# Patient Record
Sex: Female | Born: 1990 | Race: Black or African American | Hispanic: No | Marital: Single | Smoking: Never smoker
Health system: Southern US, Community
[De-identification: ages and names within clinical notes are randomized; demographics above are authoritative.]

---
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
## 2009-09-07 ENCOUNTER — Emergency Department (HOSPITAL_COMMUNITY): Admission: EM | Admit: 2009-09-07 | Discharge: 2009-09-07 | Payer: Self-pay | Admitting: Emergency Medicine

## 2010-10-08 LAB — URINE MICROSCOPIC-ADD ON

## 2010-10-08 LAB — URINALYSIS, ROUTINE W REFLEX MICROSCOPIC
Bilirubin Urine: NEGATIVE
Hgb urine dipstick: NEGATIVE
Nitrite: POSITIVE — AB
Specific Gravity, Urine: 1.037 — ABNORMAL HIGH (ref 1.005–1.030)
Urobilinogen, UA: 1 mg/dL (ref 0.0–1.0)
pH: 6 (ref 5.0–8.0)

## 2010-10-08 LAB — HEMOCCULT GUIAC POC 1CARD (OFFICE): Fecal Occult Bld: NEGATIVE

## 2011-01-31 ENCOUNTER — Emergency Department (HOSPITAL_COMMUNITY): Payer: Self-pay

## 2011-01-31 ENCOUNTER — Emergency Department (HOSPITAL_COMMUNITY)
Admission: EM | Admit: 2011-01-31 | Discharge: 2011-01-31 | Disposition: A | Discharge status: Home / Self Care | Payer: Self-pay | Attending: Emergency Medicine | Admitting: Emergency Medicine

## 2011-01-31 DIAGNOSIS — T07XXXA Unspecified multiple injuries, initial encounter: Secondary | ICD-10-CM | POA: Insufficient documentation

## 2011-01-31 DIAGNOSIS — IMO0002 Reserved for concepts with insufficient information to code with codable children: Secondary | ICD-10-CM | POA: Insufficient documentation

## 2011-01-31 DIAGNOSIS — H5710 Ocular pain, unspecified eye: Secondary | ICD-10-CM | POA: Insufficient documentation

## 2011-01-31 DIAGNOSIS — R6884 Jaw pain: Secondary | ICD-10-CM | POA: Insufficient documentation

## 2011-06-23 ENCOUNTER — Emergency Department (HOSPITAL_COMMUNITY)
Admission: EM | Admit: 2011-06-23 | Discharge: 2011-06-23 | Disposition: A | Discharge status: Home / Self Care | Payer: Self-pay | Attending: Emergency Medicine | Admitting: Emergency Medicine

## 2011-06-23 ENCOUNTER — Encounter: Payer: Self-pay | Admitting: Emergency Medicine

## 2011-06-23 DIAGNOSIS — R070 Pain in throat: Secondary | ICD-10-CM | POA: Insufficient documentation

## 2011-06-23 DIAGNOSIS — B9789 Other viral agents as the cause of diseases classified elsewhere: Secondary | ICD-10-CM | POA: Insufficient documentation

## 2011-06-23 DIAGNOSIS — R5381 Other malaise: Secondary | ICD-10-CM | POA: Insufficient documentation

## 2011-06-23 DIAGNOSIS — R5383 Other fatigue: Secondary | ICD-10-CM | POA: Insufficient documentation

## 2011-06-23 DIAGNOSIS — R51 Headache: Secondary | ICD-10-CM | POA: Insufficient documentation

## 2011-06-23 DIAGNOSIS — B349 Viral infection, unspecified: Secondary | ICD-10-CM

## 2011-06-23 DIAGNOSIS — R110 Nausea: Secondary | ICD-10-CM | POA: Insufficient documentation

## 2011-06-23 DIAGNOSIS — J3489 Other specified disorders of nose and nasal sinuses: Secondary | ICD-10-CM | POA: Insufficient documentation

## 2011-06-23 LAB — URINALYSIS, ROUTINE W REFLEX MICROSCOPIC
Bilirubin Urine: NEGATIVE
Glucose, UA: NEGATIVE mg/dL
Hgb urine dipstick: NEGATIVE
Specific Gravity, Urine: 1.009 (ref 1.005–1.030)

## 2011-06-23 LAB — POCT PREGNANCY, URINE: Preg Test, Ur: NEGATIVE

## 2011-06-23 LAB — URINE MICROSCOPIC-ADD ON

## 2011-06-23 MED ORDER — ONDANSETRON 4 MG PO TBDP: 4.0000 mg | ORAL_TABLET | Freq: Once | ORAL | Status: DC

## 2011-06-23 MED ORDER — IBUPROFEN 200 MG PO TABS
600.0000 mg | ORAL_TABLET | Freq: Once | ORAL | Status: AC
  Administered 2011-06-23: 600 mg via ORAL
  Filled 2011-06-23: qty 3

## 2011-06-23 MED ORDER — FENTANYL CITRATE 0.05 MG/ML IJ SOLN
INTRAMUSCULAR | Status: AC
  Filled 2011-06-23: qty 2

## 2011-06-23 MED ORDER — ONDANSETRON HCL 4 MG/2ML IJ SOLN
INTRAMUSCULAR | Status: AC
  Administered 2011-06-23: 4 mg
  Filled 2011-06-23: qty 2

## 2011-06-23 MED ORDER — PROMETHAZINE HCL 25 MG PO TABS: 25.0000 mg | ORAL_TABLET | Freq: Four times a day (QID) | ORAL | Status: AC | PRN

## 2011-06-23 NOTE — ED Notes (Signed)
Pt to be discharged after treatment.  Pt still receiving IV fluids at this time. Plan of care discussed with patient.  Pt verbalized understanding.

## 2011-06-23 NOTE — ED Notes (Signed)
Pt presents from home with complaints of headache and nausea. Protocols initiated upon arrival to dept.

## 2011-06-23 NOTE — ED Notes (Signed)
Per pt report, pt awakened early yesterday morning with headache.  Per pt report, headache initially progressively worst accompanied with nausea mild dizziness.  Pt denies any vomiting.  Pt able to ambulate independently with even and steady gait.  Pt denies dizziness with ambulation.  Pt. Resting in bed with no acute distress at this time.

## 2011-06-23 NOTE — ED Notes (Signed)
Pt endorses nausea at this time.  Pt denies pain at this time.

## 2011-06-23 NOTE — ED Notes (Signed)
Pt provided with discharge instructions.  Pt provided with work excuse and prescription.  Discharge plan discussed with pt. Pt verbalized understanding.

## 2011-06-23 NOTE — ED Provider Notes (Signed)
History     CSN: [id number] Arrival date & time: 06/23/2011  8:22 AM   First MD Initiated Contact with Patient 06/23/11 0845      Chief Complaint  Patient presents with  . Headache  . Nausea    (Consider location/radiation/quality/duration/timing/severity/associated sxs/prior treatment) The history is provided by the patient.   patient is a 20 year old female with no medical problems he presents with nausea and general malaise. She states this started about one to 2 days ago. It has been constant and gradually worsening. She has mild rhinorrhea and had a mild sore throat yesterday but that has resolved. No cough. No dyspnea. No myalgias. Patient also denies fever. No dysuria, flank pain, vomiting or diarrhea. Also denies rash or recent travel. No sick contacts. Overall severity is mild. Nothing noted to make it better or worse.    History reviewed. No pertinent past medical history.  History reviewed. No pertinent past surgical history.  History reviewed. No pertinent family history.  History  Substance Use Topics  . Smoking status: Not on file  . Smokeless tobacco: Not on file  . Alcohol Use: 1.0 oz/week    2 drink(s) per week    OB History    Grav Para Term Preterm Abortions TAB SAB Ect Mult Living                  Review of Systems  Constitutional: Negative for fever and chills.  HENT: Positive for congestion and rhinorrhea. Negative for facial swelling.   Eyes: Negative for visual disturbance.  Respiratory: Negative for cough, chest tightness, shortness of breath and wheezing.   Cardiovascular: Negative for chest pain.  Gastrointestinal: Positive for nausea. Negative for vomiting, abdominal pain and diarrhea.  Genitourinary: Negative for difficulty urinating.  Skin: Negative for rash.  Neurological: Negative for weakness and numbness.  Psychiatric/Behavioral: Negative for behavioral problems and confusion.  All other systems reviewed and are  negative.    Allergies  Review of patient's allergies indicates no known allergies.  Home Medications   Current Outpatient Rx  Name Route Sig Dispense Refill  . ASPIRIN 325 MG PO TABS Oral Take 650 mg by mouth once. discomfort     . PROMETHAZINE HCL 25 MG PO TABS Oral Take 1 tablet (25 mg total) by mouth every 6 (six) hours as needed for nausea. 30 tablet 0    BP 119/71  Pulse 98  Temp(Src) 98.6 F (37 C) (Oral)  Resp 18  SpO2 100%  LMP 06/09/2011  Physical Exam  Nursing note and vitals reviewed. Constitutional: She is oriented to person, place, and time. She appears well-developed and well-nourished. No distress.  HENT:  Head: Normocephalic.  Nose: Nose normal.  Mouth/Throat: Oropharynx is clear and moist. No oropharyngeal exudate.  Eyes: EOM are normal.  Neck: Normal range of motion. Neck supple.  Cardiovascular: Normal rate, regular rhythm and intact distal pulses.   Pulmonary/Chest: Effort normal and breath sounds normal. No respiratory distress.  Abdominal: Soft. She exhibits no distension. There is no tenderness.  Musculoskeletal: Normal range of motion. She exhibits no edema and no tenderness.  Neurological: She is alert and oriented to person, place, and time.       Normal strength  Skin: Skin is warm and dry. No rash noted. She is not diaphoretic.  Psychiatric: She has a normal mood and affect. Her behavior is normal. Thought content normal.    ED Course  Procedures (including critical care time)  Labs Reviewed  URINALYSIS, ROUTINE W REFLEX  MICROSCOPIC - Abnormal; Notable for the following:    Leukocytes, UA SMALL (*)    All other components within normal limits  URINE MICROSCOPIC-ADD ON - Abnormal; Notable for the following:    Squamous Epithelial / LPF FEW (*)    All other components within normal limits  POCT PREGNANCY, URINE  POCT URINALYSIS DIPSTICK   No results found.   1. Systemic viral illness       MDM  The patient is well-hydrated  well-appearing and exam. Vital signs are normal and room air sats are 100%. He PT and UA are negative. Patient's abdomen is soft and nontender. Clinical picture is consistent with systemic viral illness. No cough or myalgias to suggest flu. No signs or symptoms of pulmonary or intra-abdominal infection. Patient given symptom control and is stable for discharge. Return precautions discussed.         Milus Glazier 06/23/11 1007

## 2011-06-24 NOTE — ED Provider Notes (Signed)
I saw and evaluated the patient, reviewed the resident's note and I agree with the findings and plan. Likely viral infection. Feels better after treatment will be discharged home  Juliet Rude. Rubin Payor, MD 06/24/11 1945

## 2011-08-16 ENCOUNTER — Emergency Department (HOSPITAL_BASED_OUTPATIENT_CLINIC_OR_DEPARTMENT_OTHER)
Admission: EM | Admit: 2011-08-16 | Discharge: 2011-08-16 | Disposition: A | Discharge status: Home / Self Care | Payer: No Typology Code available for payment source | Attending: Emergency Medicine | Admitting: Emergency Medicine

## 2011-08-16 ENCOUNTER — Encounter (HOSPITAL_BASED_OUTPATIENT_CLINIC_OR_DEPARTMENT_OTHER): Payer: Self-pay

## 2011-08-16 ENCOUNTER — Emergency Department (INDEPENDENT_AMBULATORY_CARE_PROVIDER_SITE_OTHER): Payer: No Typology Code available for payment source

## 2011-08-16 DIAGNOSIS — S139XXA Sprain of joints and ligaments of unspecified parts of neck, initial encounter: Secondary | ICD-10-CM | POA: Insufficient documentation

## 2011-08-16 DIAGNOSIS — S60222A Contusion of left hand, initial encounter: Secondary | ICD-10-CM

## 2011-08-16 DIAGNOSIS — S161XXA Strain of muscle, fascia and tendon at neck level, initial encounter: Secondary | ICD-10-CM

## 2011-08-16 DIAGNOSIS — S8000XA Contusion of unspecified knee, initial encounter: Secondary | ICD-10-CM

## 2011-08-16 DIAGNOSIS — Y92410 Unspecified street and highway as the place of occurrence of the external cause: Secondary | ICD-10-CM | POA: Insufficient documentation

## 2011-08-16 DIAGNOSIS — S60229A Contusion of unspecified hand, initial encounter: Secondary | ICD-10-CM | POA: Insufficient documentation

## 2011-08-16 DIAGNOSIS — M542 Cervicalgia: Secondary | ICD-10-CM

## 2011-08-16 MED ORDER — IBUPROFEN 800 MG PO TABS: 800.0000 mg | ORAL_TABLET | Freq: Three times a day (TID) | ORAL | Status: AC

## 2011-08-16 MED ORDER — IBUPROFEN 800 MG PO TABS
800.0000 mg | ORAL_TABLET | Freq: Once | ORAL | Status: AC
  Administered 2011-08-16: 800 mg via ORAL
  Filled 2011-08-16: qty 1
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
## 2011-08-19 ENCOUNTER — Emergency Department (HOSPITAL_BASED_OUTPATIENT_CLINIC_OR_DEPARTMENT_OTHER)
Admission: EM | Admit: 2011-08-19 | Discharge: 2011-08-19 | Disposition: A | Discharge status: Home / Self Care | Payer: No Typology Code available for payment source | Attending: Emergency Medicine | Admitting: Emergency Medicine

## 2011-08-19 ENCOUNTER — Encounter (HOSPITAL_BASED_OUTPATIENT_CLINIC_OR_DEPARTMENT_OTHER): Payer: Self-pay | Admitting: Family Medicine

## 2011-08-19 ENCOUNTER — Emergency Department (INDEPENDENT_AMBULATORY_CARE_PROVIDER_SITE_OTHER): Payer: No Typology Code available for payment source

## 2011-08-19 DIAGNOSIS — M79609 Pain in unspecified limb: Secondary | ICD-10-CM

## 2011-08-19 DIAGNOSIS — M949 Disorder of cartilage, unspecified: Secondary | ICD-10-CM

## 2011-08-19 DIAGNOSIS — S60229A Contusion of unspecified hand, initial encounter: Secondary | ICD-10-CM | POA: Insufficient documentation
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
## 2011-08-22 NOTE — ED Provider Notes (Signed)
Medical screening examination/treatment/procedure(s) were performed by non-physician practitioner and as supervising physician I was immediately available for consultation/collaboration.   Loren Racer, MD 08/22/11 1700

## 2012-06-21 ENCOUNTER — Emergency Department (HOSPITAL_COMMUNITY)
Admission: EM | Admit: 2012-06-21 | Discharge: 2012-06-21 | Discharge status: Against Medical Advice | Payer: Self-pay | Attending: Emergency Medicine | Admitting: Emergency Medicine

## 2012-06-21 ENCOUNTER — Encounter (HOSPITAL_COMMUNITY): Payer: Self-pay | Admitting: *Deleted

## 2012-06-21 DIAGNOSIS — R5381 Other malaise: Secondary | ICD-10-CM | POA: Insufficient documentation

## 2012-06-21 DIAGNOSIS — F17200 Nicotine dependence, unspecified, uncomplicated: Secondary | ICD-10-CM | POA: Insufficient documentation

## 2012-06-21 DIAGNOSIS — R112 Nausea with vomiting, unspecified: Secondary | ICD-10-CM | POA: Insufficient documentation

## 2012-06-21 NOTE — ED Notes (Signed)
Pt states she doesn't want to be here; states she didn't call EMS that her coworkers did; states she has already called someone to come get her; states is nauseated but doesn't have any money to stay and get treatment

## 2012-06-21 NOTE — ED Notes (Signed)
Pt refused labs.  

## 2012-06-21 NOTE — ED Notes (Signed)
Per EMS pt c/o nausea/vomiting; generalized weakness;

## 2012-06-21 NOTE — ED Notes (Signed)
Pt called for family to pick her up; on arrival pt refusing to stay; signed elopement form; ambulated to car; instructed to return at any time

## 2014-07-13 ENCOUNTER — Encounter (HOSPITAL_BASED_OUTPATIENT_CLINIC_OR_DEPARTMENT_OTHER): Payer: Self-pay

## 2014-07-13 ENCOUNTER — Emergency Department (HOSPITAL_BASED_OUTPATIENT_CLINIC_OR_DEPARTMENT_OTHER)
Admission: EM | Admit: 2014-07-13 | Discharge: 2014-07-13 | Disposition: A | Discharge status: Home / Self Care | Payer: No Typology Code available for payment source | Attending: Emergency Medicine | Admitting: Emergency Medicine

## 2014-07-13 DIAGNOSIS — Z23 Encounter for immunization: Secondary | ICD-10-CM | POA: Insufficient documentation

## 2014-07-13 DIAGNOSIS — Y9289 Other specified places as the place of occurrence of the external cause: Secondary | ICD-10-CM | POA: Insufficient documentation

## 2014-07-13 DIAGNOSIS — Y998 Other external cause status: Secondary | ICD-10-CM | POA: Insufficient documentation

## 2014-07-13 DIAGNOSIS — S56909A Unspecified injury of unspecified muscles, fascia and tendons at forearm level, unspecified arm, initial encounter: Secondary | ICD-10-CM | POA: Insufficient documentation

## 2014-07-13 DIAGNOSIS — Z720 Tobacco use: Secondary | ICD-10-CM | POA: Insufficient documentation

## 2014-07-13 DIAGNOSIS — S0083XA Contusion of other part of head, initial encounter: Secondary | ICD-10-CM | POA: Insufficient documentation

## 2014-07-13 DIAGNOSIS — Y9389 Activity, other specified: Secondary | ICD-10-CM | POA: Insufficient documentation

## 2014-07-13 MED ORDER — BACITRACIN-NEOMYCIN-POLYMYXIN OINTMENT TUBE
TOPICAL_OINTMENT | Freq: Three times a day (TID) | CUTANEOUS | Status: DC
  Administered 2014-07-13: 1 via TOPICAL
  Filled 2014-07-13: qty 15

## 2014-07-13 MED ORDER — TETANUS-DIPHTH-ACELL PERTUSSIS 5-2.5-18.5 LF-MCG/0.5 IM SUSP
0.5000 mL | Freq: Once | INTRAMUSCULAR | Status: AC
  Administered 2014-07-13: 0.5 mL via INTRAMUSCULAR
  Filled 2014-07-13: qty 0.5

## 2014-07-13 NOTE — ED Notes (Signed)
I completed wound care per Dr. Read DriversMolpus. I gently used a Chlora-swab on wound (left lower cheek). I then applied bacitracin, and covered with 2x2's and secured with paper tape. I gave patient additional supplies for home care.
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
## 2014-07-13 NOTE — ED Notes (Signed)
Pt states that she has a safe place to go at d/c. Friend at bedside.
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
## 2015-08-09 ENCOUNTER — Encounter (HOSPITAL_BASED_OUTPATIENT_CLINIC_OR_DEPARTMENT_OTHER): Payer: Self-pay | Admitting: Emergency Medicine

## 2015-08-09 ENCOUNTER — Emergency Department (HOSPITAL_BASED_OUTPATIENT_CLINIC_OR_DEPARTMENT_OTHER)
Admission: EM | Admit: 2015-08-09 | Discharge: 2015-08-10 | Disposition: A | Discharge status: Home / Self Care | Payer: Self-pay | Attending: Emergency Medicine | Admitting: Emergency Medicine

## 2015-08-09 DIAGNOSIS — F17200 Nicotine dependence, unspecified, uncomplicated: Secondary | ICD-10-CM | POA: Insufficient documentation

## 2015-08-09 DIAGNOSIS — A5901 Trichomonal vulvovaginitis: Secondary | ICD-10-CM | POA: Insufficient documentation

## 2015-08-09 DIAGNOSIS — Z3202 Encounter for pregnancy test, result negative: Secondary | ICD-10-CM | POA: Insufficient documentation

## 2015-08-09 NOTE — ED Notes (Signed)
Patient states that she is having lower pelvic pain x 1 week. She feels like she may have a tampon lost in her vaginal area period ended last week. Reports that she has a discharge at times

## 2015-08-10 LAB — URINALYSIS, ROUTINE W REFLEX MICROSCOPIC
Bilirubin Urine: NEGATIVE
GLUCOSE, UA: NEGATIVE mg/dL
HGB URINE DIPSTICK: NEGATIVE
Ketones, ur: NEGATIVE mg/dL
Nitrite: POSITIVE — AB
Protein, ur: NEGATIVE mg/dL
SPECIFIC GRAVITY, URINE: 1.019 (ref 1.005–1.030)
pH: 6 (ref 5.0–8.0)

## 2015-08-10 LAB — URINE MICROSCOPIC-ADD ON

## 2015-08-10 LAB — WET PREP, GENITAL
Clue Cells Wet Prep HPF POC: NONE SEEN
SPERM: NONE SEEN
Yeast Wet Prep HPF POC: NONE SEEN

## 2015-08-10 LAB — PREGNANCY, URINE: Preg Test, Ur: NEGATIVE

## 2015-08-10 MED ORDER — METRONIDAZOLE 500 MG PO TABS
2000.0000 mg | ORAL_TABLET | Freq: Once | ORAL | Status: AC
  Administered 2015-08-10: 2000 mg via ORAL
  Filled 2015-08-10: qty 4

## 2015-08-10 NOTE — Discharge Instructions (Signed)
Trichomoniasis Trichomoniasis is an infection caused by an organism called Trichomonas. The infection can affect both women and men. In women, the outer female genitalia and the vagina are affected. In men, the penis is mainly affected, but the prostate and other reproductive organs can also be involved. Trichomoniasis is a sexually transmitted infection (STI) and is most often passed to another person through sexual contact.  RISK FACTORS  Having unprotected sexual intercourse.  Having sexual intercourse with an infected partner. SIGNS AND SYMPTOMS  Symptoms of trichomoniasis in women include:  Abnormal gray-green frothy vaginal discharge.  Itching and irritation of the vagina.  Itching and irritation of the area outside the vagina. Symptoms of trichomoniasis in men include:   Penile discharge with or without pain.  Pain during urination. This results from inflammation of the urethra. DIAGNOSIS  Trichomoniasis may be found during a Pap test or physical exam. Your health care provider may use one of the following methods to help diagnose this infection:  Testing the pH of the vagina with a test tape.  Using a vaginal swab test that checks for the Trichomonas organism. A test is available that provides results within a few minutes.  Examining a urine sample.  Testing vaginal secretions. Your health care provider may test you for other STIs, including HIV. TREATMENT   You may be given medicine to fight the infection. Women should inform their health care provider if they could be or are pregnant. Some medicines used to treat the infection should not be taken during pregnancy.  Your health care provider may recommend over-the-counter medicines or creams to decrease itching or irritation.  Your sexual partner will need to be treated if infected.  Your health care provider may test you for infection again 3 months after treatment. HOME CARE INSTRUCTIONS   Take medicines only as  directed by your health care provider.  Take over-the-counter medicine for itching or irritation as directed by your health care provider.  Do not have sexual intercourse while you have the infection.  Women should not douche or wear tampons while they have the infection.  Discuss your infection with your partner. Your partner may have gotten the infection from you, or you may have gotten it from your partner.  Have your sex partner get examined and treated if necessary.  Practice safe, informed, and protected sex.  See your health care provider for other STI testing. SEEK MEDICAL CARE IF:   You still have symptoms after you finish your medicine.  You develop abdominal pain.  You have pain when you urinate.  You have bleeding after sexual intercourse.  You develop a rash.  Your medicine makes you sick or makes you throw up (vomit). MAKE SURE YOU:  Understand these instructions.  Will watch your condition.  Will get help right away if you are not doing well or get worse.   This information is not intended to replace advice given to you by your health care provider. Make sure you discuss any questions you have with your health care provider.   Document Released: 12/28/2000 Document Revised: 07/25/2014 Document Reviewed: 04/15/2013 Elsevier Interactive Patient Education 2016 [employer]  

## 2015-08-10 NOTE — ED Provider Notes (Signed)
CSN: [id number]     Arrival date & time 08/09/15  2305 History   First MD Initiated Contact with Patient 08/10/15 0104     Chief Complaint  Patient presents with  . Retained Tampon      (Consider location/radiation/quality/duration/timing/severity/associated sxs/prior Treatment) HPI  This is a 25 year old female who believes she left a tampon in her vagina 2 weeks ago. She does not recall removing it after her period ended. She is now having an abnormal vaginal odor with a white discharge. She is also having some mild pelvic pain. She denies dysuria, fever, chills, nausea, vomiting, diarrhea or vaginal bleeding.  History reviewed. No pertinent past medical history. History reviewed. No pertinent past surgical history. History reviewed. No pertinent family history. Social History  Substance Use Topics  . Smoking status: Current Every Day Smoker -- 0.50 packs/day  . Smokeless tobacco: None  . Alcohol Use: 1.0 oz/week    2 drink(s) per week   OB History    No data available     Review of Systems  All other systems reviewed and are negative.   Allergies  Review of patient's allergies indicates no known allergies.  Home Medications   Prior to Admission medications   Not on File   BP 123/82 mmHg  Pulse 81  Temp(Src) 98 F (36.7 C) (Oral)  Resp 18  Wt 140 lb (63.504 kg)  SpO2 100%  LMP 08/02/2015   Physical Exam  General: Well-developed, well-nourished female in no acute distress; appearance consistent with age of record HENT: normocephalic; atraumatic Eyes: pupils equal, round and reactive to light; extraocular muscles intact Neck: supple Heart: regular rate and rhythm Lungs: clear to auscultation bilaterally Abdomen: soft; nondistended; nontender; no masses or hepatosplenomegaly; bowel sounds present GU: Normal external genitalia; white vaginal discharge; no vaginal bleeding; no cervical motion tenderness; no retained tampon Extremities: No deformity; full range of  motion; pulses normal Neurologic: Awake, alert and oriented; motor function intact in all extremities and symmetric; no facial droop Skin: Warm and dry Psychiatric: Normal mood and affect    ED Course  Procedures (including critical care time)   MDM   Nursing notes and vitals signs, including pulse oximetry, reviewed.  Summary of this visit's results, reviewed by myself:  Labs:  Results for orders placed or performed during the hospital encounter of 08/09/15 (from the past 24 hour(s))  Urinalysis, Routine w reflex microscopic (not at [facility])     Status: Abnormal   Collection Time: 08/09/15 11:45 PM  Result Value Ref Range   Color, Urine YELLOW YELLOW   APPearance CLOUDY (A) CLEAR   Specific Gravity, Urine 1.019 1.005 - 1.030   pH 6.0 5.0 - 8.0   Glucose, UA NEGATIVE NEGATIVE mg/dL   Hgb urine dipstick NEGATIVE NEGATIVE   Bilirubin Urine NEGATIVE NEGATIVE   Ketones, ur NEGATIVE NEGATIVE mg/dL   Protein, ur NEGATIVE NEGATIVE mg/dL   Nitrite POSITIVE (A) NEGATIVE   Leukocytes, UA MODERATE (A) NEGATIVE  Urine microscopic-add on     Status: Abnormal   Collection Time: 08/09/15 11:45 PM  Result Value Ref Range   Squamous Epithelial / LPF 0-5 (A) NONE SEEN   WBC, UA 6-30 0 - 5 WBC/hpf   RBC / HPF 0-5 0 - 5 RBC/hpf   Bacteria, UA MANY (A) NONE SEEN  Pregnancy, urine     Status: None   Collection Time: 08/09/15 11:55 PM  Result Value Ref Range   Preg Test, Ur NEGATIVE NEGATIVE  Wet prep, genital  Status: Abnormal   Collection Time: 08/10/15  1:30 AM  Result Value Ref Range   Yeast Wet Prep HPF POC NONE SEEN NONE SEEN   Trich, Wet Prep PRESENT (A) NONE SEEN   Clue Cells Wet Prep HPF POC NONE SEEN NONE SEEN   WBC, Wet Prep HPF POC MODERATE (A) NONE SEEN   Sperm NONE SEEN    Urine sent for culture. Since patient is asymptomatic we will not treat for urinary tract infection at this time. The pyuria may be related to her trichomoniasis.      Paula Libra, MD 08/10/15  [phone]

## 2015-08-11 LAB — GC/CHLAMYDIA PROBE AMP (~~LOC~~) NOT AT ARMC
CHLAMYDIA, DNA PROBE: NEGATIVE
NEISSERIA GONORRHEA: NEGATIVE

## 2015-08-12 LAB — URINE CULTURE: Culture: >=100000

## 2015-08-13 ENCOUNTER — Telehealth (HOSPITAL_BASED_OUTPATIENT_CLINIC_OR_DEPARTMENT_OTHER): Payer: Self-pay | Admitting: Emergency Medicine

## 2015-08-13 NOTE — Telephone Encounter (Signed)
Post ED Visit - Positive Culture Follow-up: Successful Patient Follow-Up  Culture assessed and recommendations reviewed by: []  Enzo Bi, Pharm.D.  Celedonio Miyamoto, Pharm.D. AG>  [place], [address].D., BCPS, AAHIVP  Estella Husk, Pharm.D., BCPS, AAHIVP  Tennis Must, Pharm.D.  Sherle Poe, [address].D.  Positive urine culture E. coli   Patient discharged without antimicrobial prescription and treatment is now indicated  Organism is resistant to prescribed ED discharge antimicrobial  Patient with positive blood cultures  Changes discussed with ED provider: Gaylyn Rong PA New antibiotic prescription Keflex 500 mg po bid x 7 days Called to [employer] [address]  Contacted patient, 08/13/15 1628   Berle Mull 08/13/2015, 4:27 PM

## 2015-08-13 NOTE — Progress Notes (Signed)
ED Antimicrobial Stewardship Positive Culture Follow Up   Mary Knight is an 25 y.o. female who presented to [facility] on 08/09/2015 with a chief complaint of  Chief Complaint  Patient presents with  . Retained Tampon     Recent Results (from the past 720 hour(s))  Urine culture     Status: None   Collection Time: 08/09/15 11:55 PM  Result Value Ref Range Status   Specimen Description URINE, CLEAN CATCH  Final   Special Requests NONE  Final   Culture   Final    >=100,000 COLONIES/mL ESCHERICHIA COLI Performed at [facility]    Report Status 08/12/2015 FINAL  Final   Organism ID, Bacteria ESCHERICHIA COLI  Final      Susceptibility   Escherichia coli - MIC*    AMPICILLIN <=2 SENSITIVE Sensitive     CEFAZOLIN <=4 SENSITIVE Sensitive     CEFTRIAXONE <=1 SENSITIVE Sensitive     CIPROFLOXACIN <=0.25 SENSITIVE Sensitive     GENTAMICIN <=1 SENSITIVE Sensitive     IMIPENEM <=0.25 SENSITIVE Sensitive     NITROFURANTOIN <=16 SENSITIVE Sensitive     TRIMETH/SULFA <=20 SENSITIVE Sensitive     AMPICILLIN/SULBACTAM <=2 SENSITIVE Sensitive     PIP/TAZO <=4 SENSITIVE Sensitive     * >=100,000 COLONIES/mL ESCHERICHIA COLI  Wet prep, genital     Status: Abnormal   Collection Time: 08/10/15  1:30 AM  Result Value Ref Range Status   Yeast Wet Prep HPF POC NONE SEEN NONE SEEN Final   Trich, Wet Prep PRESENT (A) NONE SEEN Final   Clue Cells Wet Prep HPF POC NONE SEEN NONE SEEN Final   WBC, Wet Prep HPF POC MODERATE (A) NONE SEEN Final   Sperm NONE SEEN  Final     Patient discharged originally without antimicrobial agent and treatment is now indicated  New antibiotic prescription: Keflex 500 mg twice daily for 7 days  ED Provider: Gaylyn Rong, PA-C  Sherron Monday, PharmD Clinical Pharmacy Resident Pager: [phone] 08/13/2015 9:29 AM

## 2016-02-06 ENCOUNTER — Emergency Department (HOSPITAL_BASED_OUTPATIENT_CLINIC_OR_DEPARTMENT_OTHER)
Admission: EM | Admit: 2016-02-06 | Discharge: 2016-02-07 | Disposition: A | Discharge status: Home / Self Care | Payer: Self-pay | Attending: Emergency Medicine | Admitting: Emergency Medicine

## 2016-02-06 ENCOUNTER — Encounter (HOSPITAL_BASED_OUTPATIENT_CLINIC_OR_DEPARTMENT_OTHER): Payer: Self-pay | Admitting: Emergency Medicine

## 2016-02-06 DIAGNOSIS — Y939 Activity, unspecified: Secondary | ICD-10-CM | POA: Insufficient documentation

## 2016-02-06 DIAGNOSIS — S0081XA Abrasion of other part of head, initial encounter: Secondary | ICD-10-CM | POA: Insufficient documentation

## 2016-02-06 DIAGNOSIS — Y999 Unspecified external cause status: Secondary | ICD-10-CM | POA: Insufficient documentation

## 2016-02-06 DIAGNOSIS — Y929 Unspecified place or not applicable: Secondary | ICD-10-CM | POA: Insufficient documentation

## 2016-02-06 DIAGNOSIS — S300XXA Contusion of lower back and pelvis, initial encounter: Secondary | ICD-10-CM | POA: Insufficient documentation

## 2016-02-06 DIAGNOSIS — F17200 Nicotine dependence, unspecified, uncomplicated: Secondary | ICD-10-CM | POA: Insufficient documentation
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
## 2016-02-07 ENCOUNTER — Other Ambulatory Visit (HOSPITAL_BASED_OUTPATIENT_CLINIC_OR_DEPARTMENT_OTHER): Payer: Self-pay | Admitting: Emergency Medicine

## 2016-02-07 ENCOUNTER — Ambulatory Visit (HOSPITAL_BASED_OUTPATIENT_CLINIC_OR_DEPARTMENT_OTHER)
Admission: RE | Admit: 2016-02-07 | Discharge: 2016-02-07 | Disposition: A | Discharge status: Home / Self Care | Payer: Self-pay | Source: Ambulatory Visit | Attending: Emergency Medicine | Admitting: Emergency Medicine

## 2016-02-07 ENCOUNTER — Emergency Department (HOSPITAL_BASED_OUTPATIENT_CLINIC_OR_DEPARTMENT_OTHER): Payer: Self-pay

## 2016-02-07 DIAGNOSIS — R52 Pain, unspecified: Secondary | ICD-10-CM

## 2016-02-07 NOTE — ED Notes (Signed)
Pt d/c'd at 0325. See paper chart documentation during downtime.  
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
## 2016-03-24 ENCOUNTER — Emergency Department (HOSPITAL_BASED_OUTPATIENT_CLINIC_OR_DEPARTMENT_OTHER): Payer: No Typology Code available for payment source

## 2016-03-24 ENCOUNTER — Encounter (HOSPITAL_BASED_OUTPATIENT_CLINIC_OR_DEPARTMENT_OTHER): Payer: Self-pay | Admitting: *Deleted

## 2016-03-24 ENCOUNTER — Emergency Department (HOSPITAL_BASED_OUTPATIENT_CLINIC_OR_DEPARTMENT_OTHER)
Admission: EM | Admit: 2016-03-24 | Discharge: 2016-03-24 | Disposition: A | Discharge status: Home / Self Care | Payer: No Typology Code available for payment source | Attending: Emergency Medicine | Admitting: Emergency Medicine

## 2016-03-24 DIAGNOSIS — Y9389 Activity, other specified: Secondary | ICD-10-CM | POA: Diagnosis not present

## 2016-03-24 DIAGNOSIS — S39012A Strain of muscle, fascia and tendon of lower back, initial encounter: Secondary | ICD-10-CM

## 2016-03-24 DIAGNOSIS — S161XXA Strain of muscle, fascia and tendon at neck level, initial encounter: Secondary | ICD-10-CM

## 2016-03-24 DIAGNOSIS — S199XXA Unspecified injury of neck, initial encounter: Secondary | ICD-10-CM | POA: Diagnosis present

## 2016-03-24 DIAGNOSIS — Y92410 Unspecified street and highway as the place of occurrence of the external cause: Secondary | ICD-10-CM | POA: Insufficient documentation

## 2016-03-24 DIAGNOSIS — F17200 Nicotine dependence, unspecified, uncomplicated: Secondary | ICD-10-CM | POA: Diagnosis not present

## 2016-03-24 DIAGNOSIS — Y999 Unspecified external cause status: Secondary | ICD-10-CM | POA: Insufficient documentation

## 2016-03-24 LAB — PREGNANCY, URINE: PREG TEST UR: NEGATIVE

## 2016-03-24 MED ORDER — NAPROXEN 375 MG PO TABS: 375.0000 mg | ORAL_TABLET | Freq: Two times a day (BID) | ORAL | 0 refills | Status: DC
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
## 2016-03-24 NOTE — ED Notes (Signed)
MD at bedside. 
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
## 2019-12-27 DIAGNOSIS — F408 Other phobic anxiety disorders: Secondary | ICD-10-CM | POA: Diagnosis not present

## 2019-12-27 DIAGNOSIS — K6289 Other specified diseases of anus and rectum: Secondary | ICD-10-CM | POA: Diagnosis not present

## 2019-12-27 DIAGNOSIS — L249 Irritant contact dermatitis, unspecified cause: Secondary | ICD-10-CM | POA: Diagnosis not present

## 2020-07-30 ENCOUNTER — Emergency Department (HOSPITAL_BASED_OUTPATIENT_CLINIC_OR_DEPARTMENT_OTHER)
Admission: EM | Admit: 2020-07-30 | Discharge: 2020-07-31 | Disposition: A | Discharge status: Home / Self Care | Payer: Managed Care, Other (non HMO) | Attending: Emergency Medicine | Admitting: Emergency Medicine

## 2020-07-30 ENCOUNTER — Emergency Department (HOSPITAL_BASED_OUTPATIENT_CLINIC_OR_DEPARTMENT_OTHER): Payer: Managed Care, Other (non HMO)

## 2020-07-30 ENCOUNTER — Encounter (HOSPITAL_BASED_OUTPATIENT_CLINIC_OR_DEPARTMENT_OTHER): Payer: Self-pay | Admitting: *Deleted

## 2020-07-30 ENCOUNTER — Other Ambulatory Visit: Payer: Self-pay

## 2020-07-30 DIAGNOSIS — R109 Unspecified abdominal pain: Secondary | ICD-10-CM | POA: Diagnosis present

## 2020-07-30 DIAGNOSIS — K5901 Slow transit constipation: Secondary | ICD-10-CM | POA: Diagnosis not present

## 2020-07-30 LAB — CBC WITH DIFFERENTIAL/PLATELET
Abs Immature Granulocytes: 0.02 10*3/uL (ref 0.00–0.07)
Basophils Absolute: 0 10*3/uL (ref 0.0–0.1)
Basophils Relative: 1 %
Eosinophils Absolute: 0.1 10*3/uL (ref 0.0–0.5)
Eosinophils Relative: 1 %
HCT: 34.5 % — ABNORMAL LOW (ref 36.0–46.0)
Hemoglobin: 11.6 g/dL — ABNORMAL LOW (ref 12.0–15.0)
Immature Granulocytes: 0 %
Lymphocytes Relative: 42 %
Lymphs Abs: 3.4 10*3/uL (ref 0.7–4.0)
MCH: 29.3 pg (ref 26.0–34.0)
MCHC: 33.6 g/dL (ref 30.0–36.0)
MCV: 87.1 fL (ref 80.0–100.0)
Monocytes Absolute: 0.7 10*3/uL (ref 0.1–1.0)
Monocytes Relative: 9 %
Neutro Abs: 3.8 10*3/uL (ref 1.7–7.7)
Neutrophils Relative %: 47 %
Platelets: 400 10*3/uL (ref 150–400)
RBC: 3.96 MIL/uL (ref 3.87–5.11)
RDW: 14.6 % (ref 11.5–15.5)
WBC: 8.1 10*3/uL (ref 4.0–10.5)
nRBC: 0 % (ref 0.0–0.2)

## 2020-07-30 LAB — COMPREHENSIVE METABOLIC PANEL
ALT: 10 U/L (ref 0–44)
AST: 14 U/L — ABNORMAL LOW (ref 15–41)
Albumin: 3.3 g/dL — ABNORMAL LOW (ref 3.5–5.0)
Alkaline Phosphatase: 47 U/L (ref 38–126)
Anion gap: 9 (ref 5–15)
BUN: 9 mg/dL (ref 6–20)
CO2: 22 mmol/L (ref 22–32)
Calcium: 8.6 mg/dL — ABNORMAL LOW (ref 8.9–10.3)
Chloride: 107 mmol/L (ref 98–111)
Creatinine, Ser: 0.62 mg/dL (ref 0.44–1.00)
GFR, Estimated: >60 mL/min (ref >60)
Glucose, Bld: 84 mg/dL (ref 70–99)
Potassium: 3.6 mmol/L (ref 3.5–5.1)
Sodium: 138 mmol/L (ref 135–145)
Total Bilirubin: 0.3 mg/dL (ref 0.3–1.2)
Total Protein: 7.1 g/dL (ref 6.5–8.1)

## 2020-07-30 LAB — PREGNANCY, URINE: Preg Test, Ur: NEGATIVE

## 2020-07-30 NOTE — ED Triage Notes (Signed)
Abdominal pain d/t constipation.  Patient stated that she has not had a good bowel movement for years.  Small bowel movement every time she defecates.  Has tried several laxatives but it is ineffective. No PCP.

## 2020-07-30 NOTE — ED Provider Notes (Signed)
[facility] EMERGENCY DEPARTMENT Provider Note   CSN: [id number] Arrival date & time: 07/30/20  1801     History Chief Complaint  Patient presents with  . Abdominal Pain    Mary Knight is a 30 y.o. female who presents to ED with a chief complaint of abdominal pain.  States that over the past several years she has had 1-2 bowel movements a week and feels like she is not emptying her colon completely.  States that she has gone to several specialist as well as taking several laxatives without much improvement.  She states that now she is having some abdominal pain and decreased flatulence.  No vomiting or decreased p.o. intake.  Reports some bloody stools in the past but none recently.  States that "no one has ever done anything for me so I just want to know what is going on."  Has not taken any laxatives and is not on a bowel regimen currently.  No prior abdominal surgeries.  No urinary symptoms, pelvic complaints or possibility of pregnancy.  Her last bowel movement was today but states that "it was just a really small one."  HPI     History reviewed. No pertinent past medical history.  There are no problems to display for this patient.   History reviewed. No pertinent surgical history.   OB History   No obstetric history on file.     No family history on file.  Social History   Tobacco Use  . Smoking status: Never Smoker  . Smokeless tobacco: Never Used  Substance Use Topics  . Alcohol use: Yes    Alcohol/week: 2.0 standard drinks    Types: 2 Standard drinks or equivalent per week    Comment: occasionally  . Drug use: Yes    Types: Marijuana    Comment: quits 2 weeks ago    Home Medications Prior to Admission medications   Medication Sig Start Date End Date Taking? Authorizing Provider  polyethylene glycol (GOLYTELY) 236 g solution Take 4,000 mLs by mouth once for 1 dose. 07/31/20 07/31/20 Yes Tyaire Odem, PA-C  naproxen (NAPROSYN) 375 MG tablet Take 1  tablet (375 mg total) by mouth 2 (two) times daily. 03/24/16   Rolan Bucco, MD    Allergies    Amoxicillin  Review of Systems   Review of Systems  Constitutional: Negative for appetite change, chills and fever.  HENT: Negative for ear pain, rhinorrhea, sneezing and sore throat.   Eyes: Negative for photophobia and visual disturbance.  Respiratory: Negative for cough, chest tightness, shortness of breath and wheezing.   Cardiovascular: Negative for chest pain and palpitations.  Gastrointestinal: Positive for abdominal pain and constipation. Negative for blood in stool, diarrhea, nausea and vomiting.  Genitourinary: Negative for dysuria, hematuria and urgency.  Musculoskeletal: Negative for myalgias.  Skin: Negative for rash.  Neurological: Negative for dizziness, weakness and light-headedness.    Physical Exam Updated Vital Signs BP 127/90 (BP Location: Right Arm)   Pulse 68   Temp 98.4 F (36.9 C) (Oral)   Resp 18   Ht 5\' 3"  (1.6 m)   Wt 70.3 kg   LMP 07/28/2020   SpO2 100%   BMI 27.46 kg/m   Physical Exam Vitals and nursing note reviewed.  Constitutional:      General: She is not in acute distress.    Appearance: She is well-developed and well-nourished.  HENT:     Head: Normocephalic and atraumatic.     Nose: Nose normal.  Eyes:  General: No scleral icterus.       Left eye: No discharge.     Extraocular Movements: EOM normal.     Conjunctiva/sclera: Conjunctivae normal.  Cardiovascular:     Rate and Rhythm: Normal rate and regular rhythm.     Pulses: Intact distal pulses.     Heart sounds: Normal heart sounds. No murmur heard. No friction rub. No gallop.   Pulmonary:     Effort: Pulmonary effort is normal. No respiratory distress.     Breath sounds: Normal breath sounds.  Abdominal:     General: Bowel sounds are normal. There is no distension.     Palpations: Abdomen is soft.     Tenderness: There is no abdominal tenderness. There is no guarding.   Musculoskeletal:        General: No edema. Normal range of motion.     Cervical back: Normal range of motion and neck supple.  Skin:    General: Skin is warm and dry.     Findings: No rash.  Neurological:     Mental Status: She is alert.     Motor: No abnormal muscle tone.     Coordination: Coordination normal.  Psychiatric:        Mood and Affect: Mood and affect normal.     ED Results / Procedures / Treatments   Labs (all labs ordered are listed, but only abnormal results are displayed) Labs Reviewed  COMPREHENSIVE METABOLIC PANEL - Abnormal; Notable for the following components:      Result Value   Calcium 8.6 (*)    Albumin 3.3 (*)    AST 14 (*)    All other components within normal limits  CBC WITH DIFFERENTIAL/PLATELET - Abnormal; Notable for the following components:   Hemoglobin 11.6 (*)    HCT 34.5 (*)    All other components within normal limits  PREGNANCY, URINE    EKG None  Radiology DG Abdomen 1 View  Result Date: 07/30/2020 CLINICAL DATA:  Constipation EXAM: ABDOMEN - 1 VIEW COMPARISON:  None. FINDINGS: Moderate stool burden in the right colon. Nonobstructive bowel gas pattern. No organomegaly or free air. No suspicious calcification or acute bony abnormality. IMPRESSION: Moderate stool burden in the right colon.  No acute findings. Electronically Signed   By: Charlett Nose M.D.   On: 07/30/2020 23:47    Procedures Procedures (including critical care time)  Medications Ordered in ED Medications - No data to display  ED Course  I have reviewed the triage vital signs and the nursing notes.  Pertinent labs & imaging results that were available during my care of the patient were reviewed by me and considered in my medical decision making (see chart for details).    MDM Rules/Calculators/A&P                          30 year old female presenting to the ED with a chief complaint of abdominal pain.  Reports issues with constipation of the past several  years but feels like she is not emptying her colon completely.  She has gone to several specialist in the past and has been taking laxatives without much improvement.  States that now she is having abdominal pain and decreased flatulence.  Denies any vomiting or changes to p.o. intake.  No bloody stools.  Her last bowel movement was today but states that "it was just a small 1."  No prior abdominal surgeries, no pelvic complaints.  On exam  his abdomen is soft without any focal tenderness.  She is tolerating CBC, CMP unremarkable.  Pregnancy test is negative.  Abdominal x-ray shows moderate stool burden without evidence of obstruction.  I suspect that this is slow transit constipation.  Advised her to incorporate high-fiber foods into her diet but in the meantime we will give GoLytely as she is wanting something to "clear everything out."  I doubt she has an obstruction as she is tolerating p.o. intake and has no significant pain.  Patient somewhat distraught that there is no obstruction because she feels like there is a blockage in her abdomen.  Informed her the signs and symptoms that would concern [place] for an obstruction that she will need to return for.  Return precautions given.  All imaging, if done today, including plain films, CT scans, and ultrasounds, independently reviewed by me, and interpretations confirmed via formal radiology reads.  Patient is hemodynamically stable, in NAD, and able to ambulate in the ED. Evaluation does not show pathology that would require ongoing emergent intervention or inpatient treatment. I explained the diagnosis to the patient. Pain has been managed and has no complaints prior to discharge. Patient is comfortable with above plan and is stable for discharge at this time. All questions were answered prior to disposition. Strict return precautions for returning to the ED were discussed. Encouraged follow up with PCP.   An After Visit Summary was printed and given to the  patient.   Portions of this note were generated with Scientist, clinical (histocompatibility and immunogenetics). Dictation errors may occur despite best attempts at proofreading.  Final Clinical Impression(s) / ED Diagnoses Final diagnoses:  Slow transit constipation    Rx / DC Orders ED Discharge Orders         Ordered    polyethylene glycol (GOLYTELY) 236 g solution   Once        07/31/20 0000           Dietrich Pates, PA-C 07/31/20 0004    Sabino [facility], MD 08/03/20 [phone]

## 2020-07-31 MED ORDER — GOLYTELY 236 G PO SOLR: 4000.0000 mL | Freq: Once | ORAL | 0 refills | Status: AC

## 2020-07-31 NOTE — Discharge Instructions (Signed)
Your x-ray shows that you have stool in your abdomen.  Take the medication as prescribed. There is no evidence that you are having a bowel obstruction. Is important for you to follow-up with the GI doctor listed below. Make sure you are drinking plenty of fluids. Return to the ER if you start to experience worsening abdominal pain, vomiting, persistent bloody stools.

## 2020-07-31 NOTE — ED Notes (Signed)
Patient verbalizes understanding of discharge instructions. Opportunity for questioning and answers were provided. Armband removed by staff, pt discharged from ED ambulatory to home.  

## 2021-03-23 DIAGNOSIS — Z20822 Contact with and (suspected) exposure to covid-19: Secondary | ICD-10-CM | POA: Diagnosis not present

## 2021-03-23 DIAGNOSIS — U071 COVID-19: Secondary | ICD-10-CM | POA: Diagnosis not present

## 2021-05-25 DIAGNOSIS — M9902 Segmental and somatic dysfunction of thoracic region: Secondary | ICD-10-CM | POA: Diagnosis not present

## 2021-05-25 DIAGNOSIS — M9901 Segmental and somatic dysfunction of cervical region: Secondary | ICD-10-CM | POA: Diagnosis not present

## 2021-05-25 DIAGNOSIS — M9903 Segmental and somatic dysfunction of lumbar region: Secondary | ICD-10-CM | POA: Diagnosis not present

## 2021-06-02 DIAGNOSIS — Z6829 Body mass index (BMI) 29.0-29.9, adult: Secondary | ICD-10-CM | POA: Diagnosis not present

## 2021-06-02 DIAGNOSIS — Z01411 Encounter for gynecological examination (general) (routine) with abnormal findings: Secondary | ICD-10-CM | POA: Diagnosis not present

## 2021-06-02 DIAGNOSIS — Z01419 Encounter for gynecological examination (general) (routine) without abnormal findings: Secondary | ICD-10-CM | POA: Diagnosis not present

## 2021-06-02 DIAGNOSIS — N946 Dysmenorrhea, unspecified: Secondary | ICD-10-CM | POA: Diagnosis not present

## 2021-06-02 DIAGNOSIS — Z118 Encounter for screening for other infectious and parasitic diseases: Secondary | ICD-10-CM | POA: Diagnosis not present

## 2021-06-02 DIAGNOSIS — Z124 Encounter for screening for malignant neoplasm of cervix: Secondary | ICD-10-CM | POA: Diagnosis not present

## 2021-06-21 DIAGNOSIS — Z0000 Encounter for general adult medical examination without abnormal findings: Secondary | ICD-10-CM | POA: Diagnosis not present

## 2021-06-21 DIAGNOSIS — Z113 Encounter for screening for infections with a predominantly sexual mode of transmission: Secondary | ICD-10-CM | POA: Diagnosis not present

## 2021-06-21 DIAGNOSIS — Z1159 Encounter for screening for other viral diseases: Secondary | ICD-10-CM | POA: Diagnosis not present

## 2021-06-21 DIAGNOSIS — Z131 Encounter for screening for diabetes mellitus: Secondary | ICD-10-CM | POA: Diagnosis not present

## 2021-06-21 DIAGNOSIS — Z1329 Encounter for screening for other suspected endocrine disorder: Secondary | ICD-10-CM | POA: Diagnosis not present

## 2021-06-21 DIAGNOSIS — Z13220 Encounter for screening for lipoid disorders: Secondary | ICD-10-CM | POA: Diagnosis not present

## 2021-06-21 DIAGNOSIS — N946 Dysmenorrhea, unspecified: Secondary | ICD-10-CM | POA: Diagnosis not present

## 2021-08-05 IMAGING — DX DG ABDOMEN 1V
2 series · 3 of 3 positions shown · non-contrast
Comparison: None.

CLINICAL DATA: Constipation

EXAM:
ABDOMEN - 1 VIEW

[Series 1: abdomen kub · 0.14mm/px · 2 of 2 slices shown (1 of 2)]
[im 1/2]
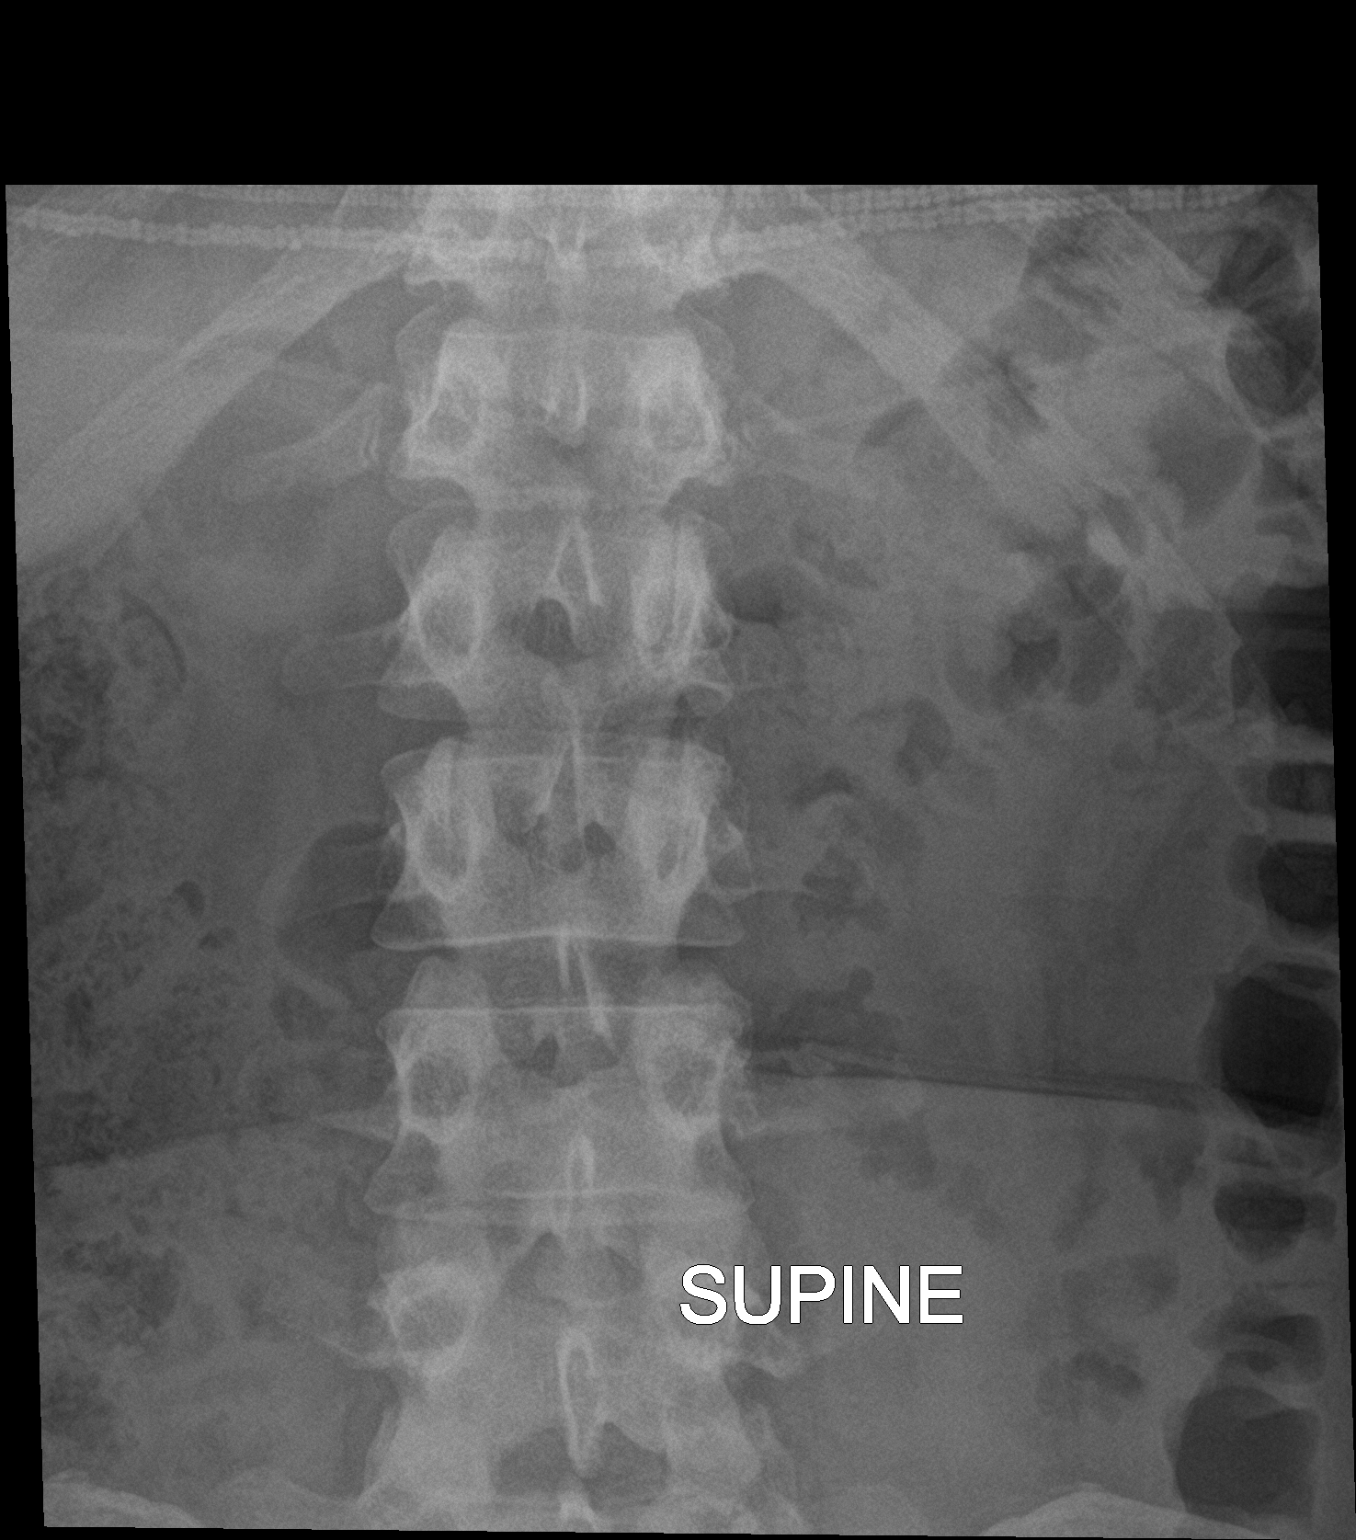
[im 2/2]
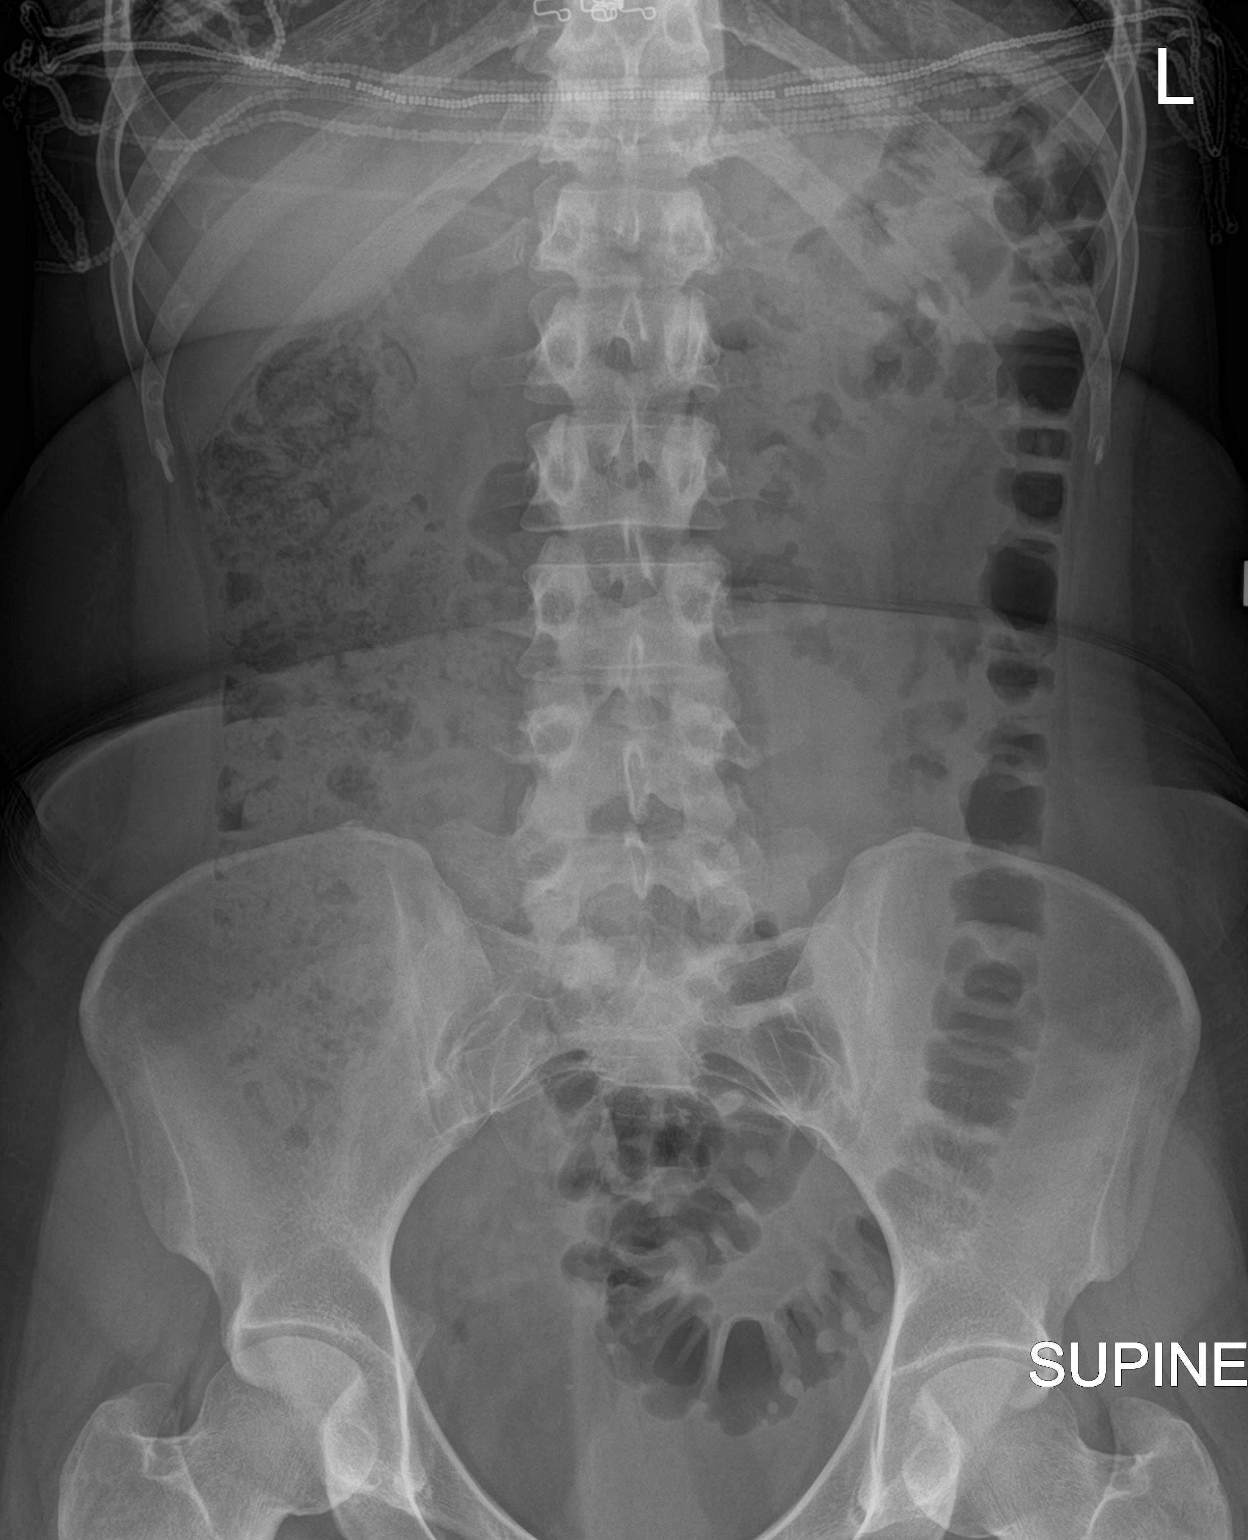

[abdomen kub (2 of 2)]
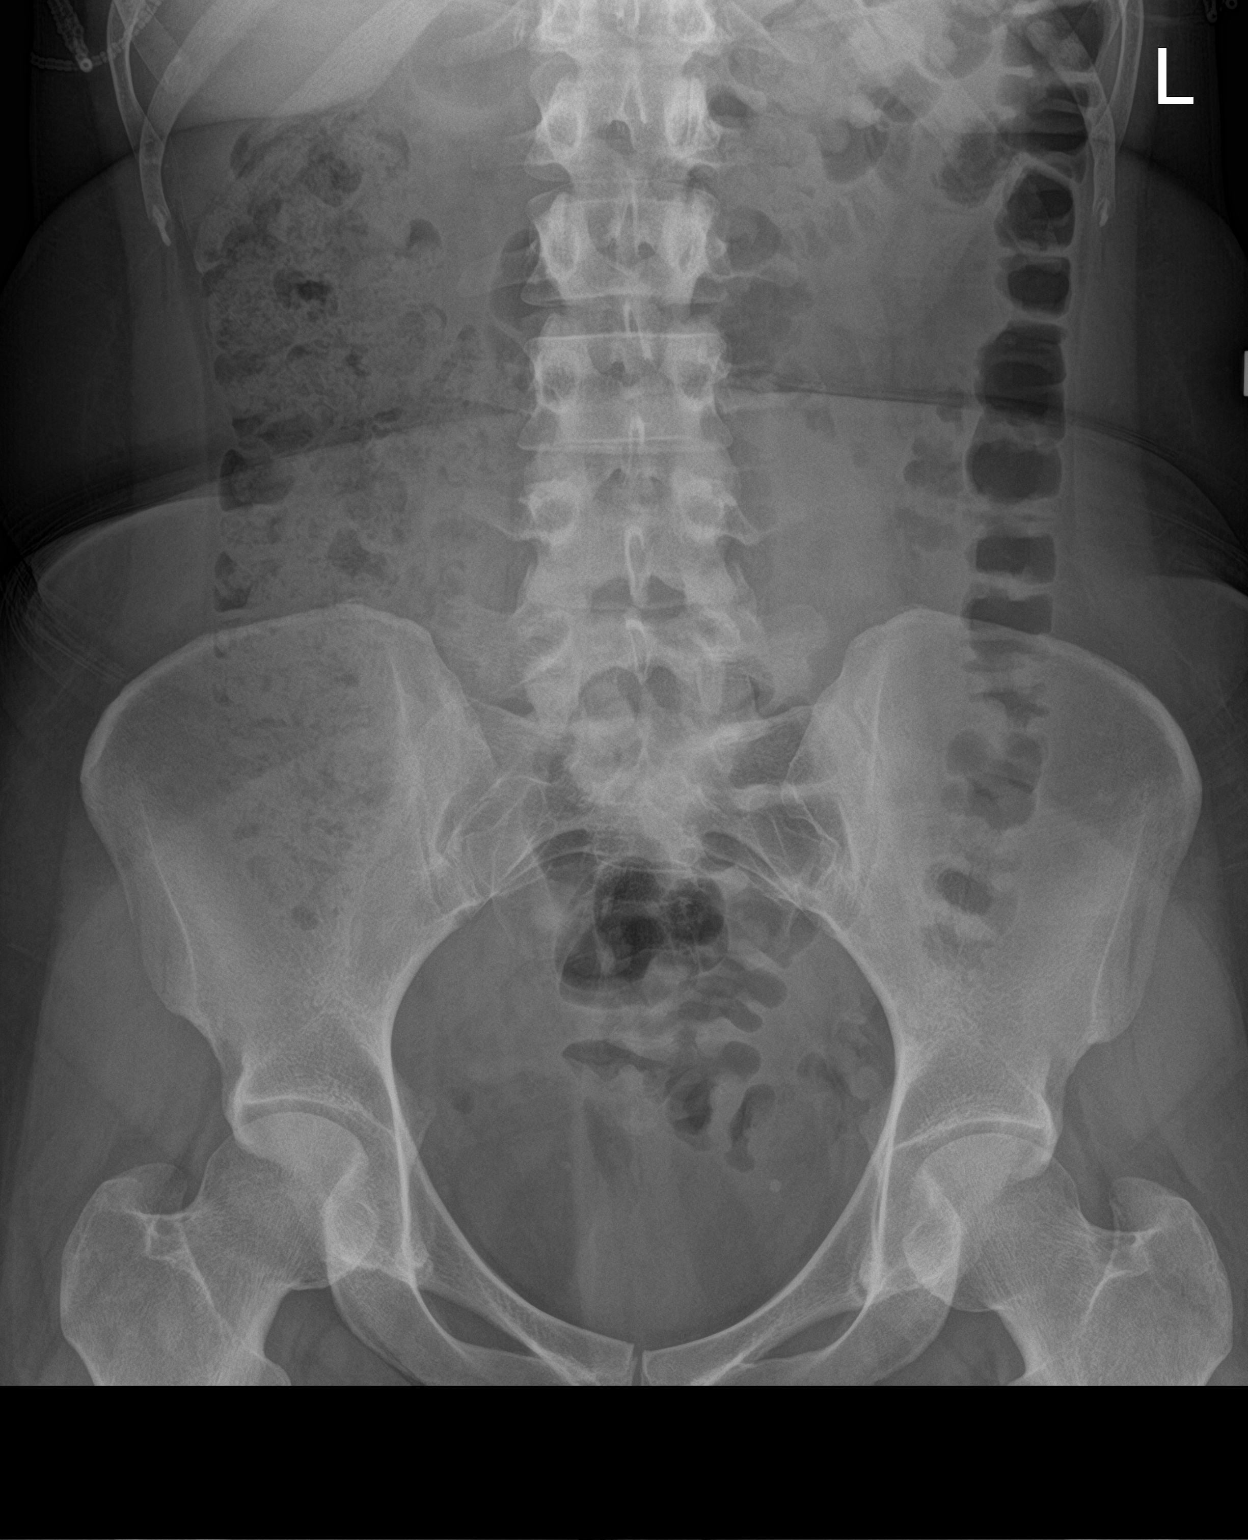

[3 of 3 positions shown; findings below may reference images not displayed]

FINDINGS: Moderate stool burden in the right colon. Nonobstructive bowel gas
pattern. No organomegaly or free air. No suspicious calcification or
acute bony abnormality.
IMPRESSION: Moderate stool burden in the right colon.  No acute findings.

## 2021-11-19 ENCOUNTER — Telehealth: Payer: BC Managed Care – PPO | Admitting: Physician Assistant

## 2021-11-19 ENCOUNTER — Ambulatory Visit: Payer: Self-pay

## 2021-11-19 DIAGNOSIS — A084 Viral intestinal infection, unspecified: Secondary | ICD-10-CM | POA: Diagnosis not present

## 2021-11-19 DIAGNOSIS — R42 Dizziness and giddiness: Secondary | ICD-10-CM

## 2021-11-19 MED ORDER — MECLIZINE HCL 25 MG PO TABS: 25.0000 mg | ORAL_TABLET | Freq: Three times a day (TID) | ORAL | 0 refills | Status: DC | PRN

## 2021-11-19 MED ORDER — ONDANSETRON 4 MG PO TBDP: 4.0000 mg | ORAL_TABLET | Freq: Three times a day (TID) | ORAL | 0 refills | Status: DC | PRN

## 2021-11-19 NOTE — Patient Instructions (Signed)
?Boykin Peek, thank you for joining Mar Daring, PA-C for today's virtual visit.  While this provider is not your primary care provider (PCP), if your PCP is located in our provider database this encounter information will be shared with them immediately following your visit. ? ?Consent: ?(Patient) Mary Knight provided verbal consent for this virtual visit at the beginning of the encounter. ? ?Current Medications: ? ?Current Outpatient Medications:  ?  meclizine (ANTIVERT) 25 MG tablet, Take 1 tablet (25 mg total) by mouth 3 (three) times daily as needed for dizziness., Disp: 30 tablet, Rfl: 0 ?  ondansetron (ZOFRAN-ODT) 4 MG disintegrating tablet, Take 1 tablet (4 mg total) by mouth every 8 (eight) hours as needed for nausea or vomiting., Disp: 20 tablet, Rfl: 0 ?  naproxen (NAPROSYN) 375 MG tablet, Take 1 tablet (375 mg total) by mouth 2 (two) times daily., Disp: 20 tablet, Rfl: 0  ? ?Medications ordered in this encounter:  ?Meds ordered this encounter  ?Medications  ? ondansetron (ZOFRAN-ODT) 4 MG disintegrating tablet  ?  Sig: Take 1 tablet (4 mg total) by mouth every 8 (eight) hours as needed for nausea or vomiting.  ?  Dispense:  20 tablet  ?  Refill:  0  ?  Order Specific Question:   Supervising Provider  ?  Answer:   Noemi Chapel [[id number]]  ? meclizine (ANTIVERT) 25 MG tablet  ?  Sig: Take 1 tablet (25 mg total) by mouth 3 (three) times daily as needed for dizziness.  ?  Dispense:  30 tablet  ?  Refill:  0  ?  Order Specific Question:   Supervising Provider  ?  Answer:   Noemi Chapel [[id number]]  ?  ? ?*If you need refills on other medications prior to your next appointment, please contact your pharmacy* ? ?Follow-Up: ?Call back or seek an in-person evaluation if the symptoms worsen or if the condition fails to improve as anticipated. ? ?Other Instructions ? ?Viral Gastroenteritis, Adult ? ?Viral gastroenteritis is also known as the stomach flu. This condition may affect your stomach, your small  intestine, and your large intestine. It can cause sudden watery poop (diarrhea), fever, and vomiting. This condition is caused by certain germs (viruses). These germs can be passed from person to person very easily (are contagious). ?Having watery poop and vomiting can make you feel weak and cause you to not have enough water in your body (get dehydrated). This can make you tired and thirsty, make you have a dry mouth, and make it so you pee (urinate) less often. It is important to replace the fluids that you lose from having watery poop and vomiting. ?What are the causes? ?You can get sick by catching germs from other people. ?You can also get sick by: ?Eating food, drinking water, or touching a surface that has the germs on it (is contaminated). ?Sharing utensils or other personal items with a person who is sick. ?What increases the risk? ?Having a weak body defense system (immune system). ?Living with one or more children who are younger than 2 years. ?Living in a nursing home. ?Going on cruise ships. ?What are the signs or symptoms? ?Symptoms of this condition start suddenly. Symptoms may last for a few days or for as long as a week. ?Common symptoms include: ?Watery poop. ?Vomiting. ?Other symptoms include: ?Fever. ?Headache. ?Feeling tired (fatigue). ?Pain in the belly (abdomen). ?Chills. ?Feeling weak. ?Feeling like you may vomit (nauseous). ?Muscle aches. ?Not feeling hungry. ?How is this treated? ?This  condition typically goes away on its own. The focus of treatment is to replace the fluids that you lose. This condition may be treated with: ?An ORS (oral rehydration solution). This is a drink that helps you replace fluids and minerals your body lost. It is sold at pharmacies and stores. ?Medicines to help with your symptoms. ?Probiotic supplements to reduce symptoms of watery poop. ?Fluids given through an IV tube, if needed. ?Older adults and people with other diseases or a weak body defense system are at  higher risk for not having enough water in the body. ?Follow these instructions at home: ?Eating and drinking ? ?Take an ORS as told by your doctor. ?Drink clear fluids in small amounts as you are able. Clear fluids include: ?Water. ?Ice chips. ?Fruit juice that has water added to it (is diluted). ?Low-calorie sports drinks. ?Drink enough fluid to keep your pee (urine) pale yellow. ?Eat small amounts of healthy foods every 3-4 hours as you are able. This may include whole grains, fruits, vegetables, lean meats, and yogurt. ?Avoid fluids that have a lot of sugar or caffeine in them. This includes energy drinks, sports drinks, and soda. ?Avoid spicy or fatty foods. ?Avoid alcohol. ?General instructions ? ?Wash your hands often. This is very important after you have watery poop or you vomit. If you cannot use soap and water, use hand sanitizer. ?Make sure that all people in your home wash their hands well and often. ?Take over-the-counter and prescription medicines only as told by your doctor. ?Rest at home while you get better. ?Watch your condition for any changes. ?Take a warm bath to help with any burning or pain from having watery poop. ?Keep all follow-up visits. ?Contact a doctor if: ?You cannot keep fluids down. ?Your symptoms get worse. ?You have new symptoms. ?You feel light-headed or dizzy. ?You have muscle cramps. ?Get help right away if: ?You have chest pain. ?You have trouble breathing, or you are breathing very fast. ?You have a fast heartbeat. ?You feel very weak or you faint. ?You have a very bad headache, a stiff neck, or both. ?You have a rash. ?You have very bad pain, cramping, or bloating in your belly. ?Your skin feels cold and clammy. ?You feel mixed up (confused). ?You have pain when you pee. ?You have signs of not having enough water in the body, such as: ?Dark pee, hardly any pee, or no pee. ?Cracked lips. ?Dry mouth. ?Sunken eyes. ?Feeling very sleepy. ?Feeling weak. ?You have signs of  bleeding, such as: ?You see blood in your vomit. ?Your vomit looks like coffee grounds. ?You have bloody or black poop or poop that looks like tar. ?These symptoms may be an emergency. Get help right away. Call 911. ?Do not wait to see if the symptoms will go away. ?Do not drive yourself to the hospital. ?Summary ?Viral gastroenteritis is also known as the stomach flu. ?This condition can cause sudden watery poop (diarrhea), fever, and vomiting. ?These germs can be passed from person to person very easily. ?Take an ORS (oral rehydration solution) as told by your doctor. This is a drink that is sold at pharmacies and stores. ?Wash your hands often, especially after having watery poop or vomiting. If you cannot use soap and water, use hand sanitizer. ?This information is not intended to replace advice given to you by your health care provider. Make sure you discuss any questions you have with your health care provider. ?Document Revised: 05/03/2021 Document Reviewed: 05/03/2021 ?Elsevier Patient Education ?  [place]. ? ? ? ?If you have been instructed to have an in-person evaluation today at a local Urgent Care facility, please use the link below. It will take you to a list of all of our available [facility], including address, phone number and hours of operation. Please do not delay care.  ?[facility] ? ?If you or a family member do not have a primary care provider, use the link below to schedule a visit and establish care. When you choose a [employer] [facility] primary care physician or advanced practice provider, you gain a long-term partner in health. ?Find a Primary Care Provider ? ?Learn more about 's in-office and virtual care options: ?[facility] ?

## 2021-11-19 NOTE — Progress Notes (Signed)
?Virtual Visit Consent  ? ?Mary Knight, you are scheduled for a virtual visit with a [facility] provider today. Just as with appointments in the office, your consent must be obtained to participate. Your consent will be active for this visit and any virtual visit you may have with one of our providers in the next 365 days. If you have a MyChart account, a copy of this consent can be sent to you electronically. ? ?As this is a virtual visit, video technology does not allow for your provider to perform a traditional examination. This may limit your provider's ability to fully assess your condition. If your provider identifies any concerns that need to be evaluated in person or the need to arrange testing (such as labs, EKG, etc.), we will make arrangements to do so. Although advances in technology are sophisticated, we cannot ensure that it will always work on either your end or our end. If the connection with a video visit is poor, the visit may have to be switched to a telephone visit. With either a video or telephone visit, we are not always able to ensure that we have a secure connection. ? ?By engaging in this virtual visit, you consent to the provision of healthcare and authorize for your insurance to be billed (if applicable) for the services provided during this visit. Depending on your insurance coverage, you may receive a charge related to this service. ? ?I need to obtain your verbal consent now. Are you willing to proceed with your visit today? Mary Knight has provided verbal consent on 11/19/2021 for a virtual visit (video or telephone). Mary Loveless, PA-C ? ?Date: 11/19/2021 5:28 PM ? ?Virtual Visit via Video Note  ? ?Mary Knight, connected with  Mary Knight  ([id number], 1990-11-13) on 11/19/21 at  5:15 PM EDT by a video-enabled telemedicine application and verified that I am speaking with the correct person using two identifiers. ? ?Location: ?Patient: Virtual Visit Location Patient:  Home ?Provider: Virtual Visit Location Provider: Home Office ?  ?I discussed the limitations of evaluation and management by telemedicine and the availability of in person appointments. The patient expressed understanding and agreed to proceed.   ? ?History of Present Illness: ?Mary Knight is a 31 y.o. who identifies as a female who was assigned female at birth, and is being seen today for abd pain, nausea, vomiting, dizziness. ? ?HPI: Abdominal Pain ?This is a new problem. The current episode started yesterday. The onset quality is sudden. The problem occurs intermittently. The problem has been unchanged. The pain is located in the LUQ. The pain is mild. The quality of the pain is sharp and cramping. Associated symptoms include diarrhea, nausea and vomiting. Pertinent negatives include no arthralgias, constipation, dysuria, fever, flatus, headaches or melena. Associated symptoms comments: Dizziness, hot and cold flashes. The pain is aggravated by eating. The pain is relieved by Nothing. She has tried nothing for the symptoms. The treatment provided no relief.   ?Possible food poisoning from subway. ? ?Problems: There are no problems to display for this patient. ?  ?Allergies:  ?Allergies  ?Allergen Reactions  ? Amoxicillin Other (See Comments)  ?  Unsure   ? ?Medications:  ?Current Outpatient Medications:  ?  meclizine (ANTIVERT) 25 MG tablet, Take 1 tablet (25 mg total) by mouth 3 (three) times daily as needed for dizziness., Disp: 30 tablet, Rfl: 0 ?  ondansetron (ZOFRAN-ODT) 4 MG disintegrating tablet, Take 1 tablet (4 mg total) by mouth every 8 (eight)  hours as needed for nausea or vomiting., Disp: 20 tablet, Rfl: 0 ?  naproxen (NAPROSYN) 375 MG tablet, Take 1 tablet (375 mg total) by mouth 2 (two) times daily., Disp: 20 tablet, Rfl: 0 ? ?Observations/Objective: ?Patient is well-developed, well-nourished in no acute distress.  ?Resting comfortably at home.  ?Head is normocephalic, atraumatic.  ?No labored  breathing.  ?Speech is clear and coherent with logical content.  ?Patient is alert and oriented at baseline.  ? ? ?Assessment and Plan: ?1. Viral gastroenteritis ?- ondansetron (ZOFRAN-ODT) 4 MG disintegrating tablet; Take 1 tablet (4 mg total) by mouth every 8 (eight) hours as needed for nausea or vomiting.  Dispense: 20 tablet; Refill: 0 ? ?2. Dizziness ?- meclizine (ANTIVERT) 25 MG tablet; Take 1 tablet (25 mg total) by mouth 3 (three) times daily as needed for dizziness.  Dispense: 30 tablet; Refill: 0 ? ?- Suspect viral gastroenteritis or food poisoning ?- Zofran for nausea ?- Meclizine for dizziness ?- Push fluids, electrolyte beverages ?- Liquid diet, then increase to soft/bland (BRAT) diet over next day, then increase diet as tolerated ?- Seek in person evaluation if not improving or symptoms worsen ? ? ?Follow Up Instructions: ?I discussed the assessment and treatment plan with the patient. The patient was provided an opportunity to ask questions and all were answered. The patient agreed with the plan and demonstrated an understanding of the instructions.  A copy of instructions were sent to the patient via MyChart unless otherwise noted below.  ? ? ?The patient was advised to call back or seek an in-person evaluation if the symptoms worsen or if the condition fails to improve as anticipated. ? ?Time:  ?I spent 15 minutes with the patient via telehealth technology discussing the above problems/concerns.   ? ?Mary Loveless, PA-C ?

## 2021-11-22 ENCOUNTER — Encounter: Payer: Self-pay | Admitting: Physician Assistant

## 2022-04-15 ENCOUNTER — Ambulatory Visit
Admission: EM | Admit: 2022-04-15 | Discharge: 2022-04-15 | Disposition: A | Discharge status: Home / Self Care | Payer: BC Managed Care – PPO | Attending: Urgent Care | Admitting: Urgent Care

## 2022-04-15 DIAGNOSIS — Z20822 Contact with and (suspected) exposure to covid-19: Secondary | ICD-10-CM | POA: Insufficient documentation

## 2022-04-15 DIAGNOSIS — B349 Viral infection, unspecified: Secondary | ICD-10-CM | POA: Insufficient documentation

## 2022-04-15 DIAGNOSIS — Z720 Tobacco use: Secondary | ICD-10-CM | POA: Insufficient documentation

## 2022-04-15 MED ORDER — PSEUDOEPHEDRINE HCL 60 MG PO TABS: 60.0000 mg | ORAL_TABLET | Freq: Three times a day (TID) | ORAL | 0 refills | Status: DC | PRN

## 2022-04-15 MED ORDER — PROMETHAZINE-DM 6.25-15 MG/5ML PO SYRP: 2.5000 mL | ORAL_SOLUTION | Freq: Three times a day (TID) | ORAL | 0 refills | Status: DC | PRN

## 2022-04-15 MED ORDER — CETIRIZINE HCL 10 MG PO TABS: 10.0000 mg | ORAL_TABLET | Freq: Every day | ORAL | 0 refills | Status: DC

## 2022-04-15 NOTE — Discharge Instructions (Addendum)

## 2022-04-15 NOTE — ED Triage Notes (Signed)
Pt state she has sx of COVID like she did in the past. Pt c/o back pain, dizziness and nasal congestion. Denies cough or fever.

## 2022-04-15 NOTE — ED Provider Notes (Signed)
[facility]  Note:  This document was prepared using Systems analyst and may include unintentional dictation errors.  MRN: [id number] DOB: 18-Dec-1990  Subjective:   Mary Knight is a 31 y.o. female presenting for acute onset since this morning of body pains, sinus congestion, malaise and fatigue, dizziness.  No fever, cough, chest pain, shortness of breath or wheezing.  Patient vapes daily.  No other chronic conditions.  Patient feels like this is COVID-19.  Would like to be tested for it.  No current facility-administered medications for this encounter.  Current Outpatient Medications:    meclizine (ANTIVERT) 25 MG tablet, Take 1 tablet (25 mg total) by mouth 3 (three) times daily as needed for dizziness., Disp: 30 tablet, Rfl: 0   naproxen (NAPROSYN) 375 MG tablet, Take 1 tablet (375 mg total) by mouth 2 (two) times daily., Disp: 20 tablet, Rfl: 0   ondansetron (ZOFRAN-ODT) 4 MG disintegrating tablet, Take 1 tablet (4 mg total) by mouth every 8 (eight) hours as needed for nausea or vomiting., Disp: 20 tablet, Rfl: 0   Allergies  Allergen Reactions   Amoxicillin Other (See Comments)    Unsure     History reviewed. No pertinent past medical history.   History reviewed. No pertinent surgical history.  History reviewed. No pertinent family history.  Social History   Tobacco Use   Smoking status: Never   Smokeless tobacco: Never  Vaping Use   Vaping Use: Every day   Substances: Nicotine  Substance Use Topics   Alcohol use: Yes    Alcohol/week: 2.0 standard drinks of alcohol    Types: 2 Standard drinks or equivalent per week    Comment: occasionally   Drug use: Yes    Types: Marijuana    Comment: occasional    ROS   Objective:   Vitals: BP 108/77 (BP Location: Right Arm)   Pulse 82   Temp 99.1 F (37.3 C) (Oral)   Resp 18   LMP 03/22/2022   SpO2 97%   Physical Exam Constitutional:      General: She is not in acute  distress.    Appearance: Normal appearance. She is well-developed. She is not ill-appearing, toxic-appearing or diaphoretic.  HENT:     Head: Normocephalic and atraumatic.     Nose: Congestion present. No rhinorrhea.     Mouth/Throat:     Mouth: Mucous membranes are moist.     Pharynx: No pharyngeal swelling, oropharyngeal exudate, posterior oropharyngeal erythema or uvula swelling.     Tonsils: No tonsillar exudate or tonsillar abscesses. 0 on the right. 0 on the left.  Eyes:     General: No scleral icterus.       Right eye: No discharge.        Left eye: No discharge.     Extraocular Movements: Extraocular movements intact.  Cardiovascular:     Rate and Rhythm: Normal rate and regular rhythm.     Heart sounds: Normal heart sounds. No murmur heard.    No friction rub. No gallop.  Pulmonary:     Effort: Pulmonary effort is normal. No respiratory distress.     Breath sounds: No stridor. No wheezing, rhonchi or rales.  Chest:     Chest wall: No tenderness.  Skin:    General: Skin is warm and dry.  Neurological:     General: No focal deficit present.     Mental Status: She is alert and oriented to person, place, and time.  Psychiatric:        Mood and Affect: Mood normal.        Behavior: Behavior normal.     Assessment and Plan :   PDMP not reviewed this encounter.  1. Acute viral syndrome   2. Vapes nicotine containing substance     Deferred imaging given clear cardiopulmonary exam, hemodynamically stable vital signs.  Patient is in general good health and has minimal risk factors, therefore does not necessarily need COVID antivirals.  Will manage for viral illness such as viral URI, viral syndrome, viral rhinitis, COVID-19. Recommended supportive care. Offered scripts for symptomatic relief. Testing is pending. Counseled patient on potential for adverse effects with medications prescribed/recommended today, ER and return-to-clinic precautions discussed, patient verbalized  understanding.     Wallis Bamberg, [place] 04/15/22 0947

## 2022-04-16 LAB — SARS CORONAVIRUS 2 (TAT 6-24 HRS): SARS Coronavirus 2: NEGATIVE

## 2022-08-10 ENCOUNTER — Ambulatory Visit
Admission: EM | Admit: 2022-08-10 | Discharge: 2022-08-10 | Disposition: A | Discharge status: Home / Self Care | Payer: BC Managed Care – PPO | Attending: Urgent Care | Admitting: Urgent Care

## 2022-08-10 DIAGNOSIS — U071 COVID-19: Secondary | ICD-10-CM | POA: Insufficient documentation

## 2022-08-10 MED ORDER — ACETAMINOPHEN 325 MG PO TABS: 650.0000 mg | ORAL_TABLET | Freq: Four times a day (QID) | ORAL | 0 refills | Status: AC | PRN

## 2022-08-10 MED ORDER — PSEUDOEPHEDRINE HCL 60 MG PO TABS: 60.0000 mg | ORAL_TABLET | Freq: Three times a day (TID) | ORAL | 0 refills | Status: AC | PRN

## 2022-08-10 MED ORDER — CETIRIZINE HCL 10 MG PO TABS: 10.0000 mg | ORAL_TABLET | Freq: Every day | ORAL | 0 refills | Status: AC

## 2022-08-10 MED ORDER — IBUPROFEN 600 MG PO TABS: 600.0000 mg | ORAL_TABLET | Freq: Four times a day (QID) | ORAL | 0 refills | Status: AC | PRN

## 2022-08-10 MED ORDER — PAXLOVID (300/100) 20 X 150 MG & 10 X 100MG PO TBPK: ORAL_TABLET | ORAL | 0 refills | Status: AC

## 2022-08-10 MED ORDER — PROMETHAZINE-DM 6.25-15 MG/5ML PO SYRP: 5.0000 mL | ORAL_SOLUTION | Freq: Three times a day (TID) | ORAL | 0 refills | Status: AC | PRN

## 2022-08-10 NOTE — ED Provider Notes (Signed)
  [facility]  Note:  This document was prepared using Systems analyst and may include unintentional dictation errors.  MRN: [id number] DOB: Oct 05, 1990  Subjective:   Mary Knight is a 32 y.o. female presenting for 2-day history of acute onset sinus congestion, coughing.  No chest pain, shortness of breath or wheezing.  Patient had a COVID exposure and then tested at home and was positive.  Would like a confirmation test from our clinic.  No history of respiratory disorders.  Patient is not sure about taking Paxlovid.  No current facility-administered medications for this encounter. No current outpatient medications on file.   Allergies  Allergen Reactions   Amoxicillin Other (See Comments)    Unsure     History reviewed. No pertinent past medical history.   History reviewed. No pertinent surgical history.  No family history on file.  Social History   Tobacco Use   Smoking status: Never   Smokeless tobacco: Never  Vaping Use   Vaping Use: Some days   Substances: Nicotine  Substance Use Topics   Alcohol use: Yes    Alcohol/week: 2.0 standard drinks of alcohol    Types: 2 Standard drinks or equivalent per week    Comment: occasionally   Drug use: Not Currently    Types: Marijuana    ROS   Objective:   Vitals: BP 130/85 (BP Location: Right Arm)   Pulse 67   Temp 98.9 F (37.2 C) (Oral)   Resp 16   LMP 07/31/2022 (Approximate)   SpO2 98%   Physical Exam Constitutional:      General: She is not in acute distress.    Appearance: Normal appearance. She is well-developed. She is not ill-appearing, toxic-appearing or diaphoretic.  HENT:     Head: Normocephalic and atraumatic.     Nose: Nose normal.     Mouth/Throat:     Mouth: Mucous membranes are moist.  Eyes:     General: No scleral icterus.       Right eye: No discharge.        Left eye: No discharge.     Extraocular Movements: Extraocular movements intact.   Cardiovascular:     Rate and Rhythm: Normal rate and regular rhythm.     Heart sounds: Normal heart sounds. No murmur heard.    No friction rub. No gallop.  Pulmonary:     Effort: Pulmonary effort is normal. No respiratory distress.     Breath sounds: No stridor. No wheezing, rhonchi or rales.  Chest:     Chest wall: No tenderness.  Skin:    General: Skin is warm and dry.  Neurological:     General: No focal deficit present.     Mental Status: She is alert and oriented to person, place, and time.  Psychiatric:        Mood and Affect: Mood normal.        Behavior: Behavior normal.     Assessment and Plan :   PDMP not reviewed this encounter.  1. Clinical diagnosis of COVID-19     Deferred imaging given clear cardiopulmonary exam, hemodynamically stable vital signs.  Discussed the usefulness of Paxlovid, patient will consider this.  Otherwise, recommend supportive care for clinical diagnosis of COVID-19. Counseled patient on potential for adverse effects with medications prescribed/recommended today, ER and return-to-clinic precautions discussed, patient verbalized understanding.    Jaynee Eagles, [place] 08/10/22 1644

## 2022-08-10 NOTE — ED Triage Notes (Addendum)
Pt c/o cough, nasal congestion x 2 days with +covid exposure-pt reports +covid home test-NAD-steady gait

## 2022-08-10 NOTE — Discharge Instructions (Addendum)
We will manage this COVID infection with supportive care. If you decide you want to take Paxlovid (nirmatrelevir & ritonavir) then you can go ahead and fill the prescription. For sore throat or cough try using a honey-based tea. Use 3 teaspoons of honey with juice squeezed from half lemon. Place shaved pieces of ginger into 1/2-1 cup of water and warm over stove top. Then mix the ingredients and repeat every 4 hours as needed. Please take ibuprofen 600mg  every 6 hours with food alternating with OR taken together with Tylenol 500mg -650mg  every 6 hours for throat pain, fevers, aches and pains. Hydrate very well with at least 2 liters of water. Eat light meals such as soups (chicken and noodles, vegetable, chicken and wild rice).  Do not eat foods that you are allergic to.  Taking an antihistamine like Zyrtec can help against postnasal drainage, sinus congestion which can cause sinus pain, sinus headaches, throat pain, painful swallowing, coughing.  You can take this together with pseudoephedrine (Sudafed) at a dose of 60 mg 3 times a day or twice daily as needed for the same kind of nasal drip, congestion.  Use the cough medications as needed.

## 2022-08-11 LAB — SARS CORONAVIRUS 2 (TAT 6-24 HRS): SARS Coronavirus 2: POSITIVE — AB

## 2023-02-24 ENCOUNTER — Ambulatory Visit
Admission: EM | Admit: 2023-02-24 | Discharge: 2023-02-24 | Disposition: A | Discharge status: Home / Self Care | Payer: BC Managed Care – PPO | Attending: Internal Medicine | Admitting: Internal Medicine

## 2023-02-24 DIAGNOSIS — Z20822 Contact with and (suspected) exposure to covid-19: Secondary | ICD-10-CM | POA: Diagnosis not present

## 2023-02-24 DIAGNOSIS — J3489 Other specified disorders of nose and nasal sinuses: Secondary | ICD-10-CM | POA: Insufficient documentation

## 2023-02-24 NOTE — ED Triage Notes (Signed)
Pt presents to UC w/ c/o fatigue and slight runny nose since this morning. Covid exposure. Wants to be tested.

## 2023-02-24 NOTE — ED Provider Notes (Signed)
[facility]    CSN: [id number] Arrival date & time: 02/24/23  1521      History   Chief Complaint No chief complaint on file.   HPI Mary Knight is a 32 y.o. female  presents for evaluation of URI symptoms for 1 days. Patient reports associated symptoms of rhinorrhea. Denies N/V/D, cough, fevers, sore throat, ear pain, body aches, shortness of breath. Patient does not have a hx of asthma or smoking.  Reports her father tested positive for COVID.  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  Patient has had COVID in the past without complication or hospitalizations.  Pt has taken nothing OTC for symptoms. Pt has no other concerns at this time.   HPI  History reviewed. No pertinent past medical history.  There are no problems to display for this patient.   History reviewed. No pertinent surgical history.  OB History     Gravida  0   Para  0   Term  0   Preterm  0   AB  0   Living  0      SAB  0   IAB  0   Ectopic  0   Multiple  0   Live Births  0            Home Medications    Prior to Admission medications   Medication Sig Start Date End Date Taking? Authorizing Provider  acetaminophen (TYLENOL) 325 MG tablet Take 2 tablets (650 mg total) by mouth every 6 (six) hours as needed for moderate pain. 08/10/22   Wallis Bamberg, PA-C  cetirizine (ZYRTEC ALLERGY) 10 MG tablet Take 1 tablet (10 mg total) by mouth daily. 08/10/22   Wallis Bamberg, PA-C  ibuprofen (ADVIL) 600 MG tablet Take 1 tablet (600 mg total) by mouth every 6 (six) hours as needed. 08/10/22   Wallis Bamberg, PA-C  nirmatrelvir & ritonavir (PAXLOVID, 300/100,) 20 x 150 MG & 10 x 100MG  TBPK Take 2 tablets nirmtrelvir and 1 tablet ritonavir twice daily. 08/10/22   Wallis Bamberg, PA-C  promethazine-dextromethorphan (PROMETHAZINE-DM) 6.25-15 MG/5ML syrup Take 5 mLs by mouth 3 (three) times daily as needed for cough. 08/10/22   Wallis Bamberg, PA-C  pseudoephedrine  (SUDAFED) 60 MG tablet Take 1 tablet (60 mg total) by mouth every 8 (eight) hours as needed for congestion. 08/10/22   Wallis Bamberg, PA-C    Family History History reviewed. No pertinent family history.  Social History Social History   Tobacco Use   Smoking status: Never   Smokeless tobacco: Never  Vaping Use   Vaping status: Some Days   Substances: Nicotine  Substance Use Topics   Alcohol use: Yes    Alcohol/week: 2.0 standard drinks of alcohol    Types: 2 Standard drinks or equivalent per week    Comment: occasionally   Drug use: Not Currently    Types: Marijuana     Allergies   Amoxicillin   Review of Systems Review of Systems  HENT:  Positive for rhinorrhea.      Physical Exam Triage Vital Signs ED Triage Vitals  Encounter Vitals Group     BP 02/24/23 1656 119/77     Systolic BP Percentile --      Diastolic BP Percentile --      Pulse Rate 02/24/23 1656 64     Resp 02/24/23 1656 18     Temp 02/24/23 1656 98.8 F (37.1 C)     Temp  Source 02/24/23 1656 Oral     SpO2 02/24/23 1656 97 %     Weight --      Height --      Head Circumference --      Peak Flow --      Pain Score 02/24/23 1704 0     Pain Loc --      Pain Education --      Exclude from Growth Chart --    No data found.  Updated Vital Signs BP 119/77 (BP Location: Right Arm)   Pulse 64   Temp 98.8 F (37.1 C) (Oral)   Resp 18   LMP 02/03/2023 (Exact Date)   SpO2 97%   Visual Acuity Right Eye Distance:   Left Eye Distance:   Bilateral Distance:    Right Eye Near:   Left Eye Near:    Bilateral Near:     Physical Exam Vitals and nursing note reviewed.  Constitutional:      General: She is not in acute distress.    Appearance: She is well-developed. She is not ill-appearing.  HENT:     Head: Normocephalic and atraumatic.     Right Ear: Tympanic membrane and ear canal normal.     Left Ear: Tympanic membrane and ear canal normal.     Nose: Rhinorrhea present. No congestion.      Mouth/Throat:     Mouth: Mucous membranes are moist.     Pharynx: Oropharynx is clear. Uvula midline. No oropharyngeal exudate or posterior oropharyngeal erythema.     Tonsils: No tonsillar exudate or tonsillar abscesses.  Eyes:     Conjunctiva/sclera: Conjunctivae normal.     Pupils: Pupils are equal, round, and reactive to light.  Cardiovascular:     Rate and Rhythm: Normal rate and regular rhythm.     Heart sounds: Normal heart sounds.  Pulmonary:     Effort: Pulmonary effort is normal.     Breath sounds: Normal breath sounds.  Musculoskeletal:     Cervical back: Normal range of motion and neck supple.  Lymphadenopathy:     Cervical: No cervical adenopathy.  Skin:    General: Skin is warm and dry.  Neurological:     General: No focal deficit present.     Mental Status: She is alert and oriented to person, place, and time.  Psychiatric:        Mood and Affect: Mood normal.        Behavior: Behavior normal.      UC Treatments / Results  Labs (all labs ordered are listed, but only abnormal results are displayed) Labs Reviewed  SARS CORONAVIRUS 2 (TAT 6-24 HRS)    EKG   Radiology No results found.  Procedures Procedures (including critical care time)  Medications Ordered in UC Medications - No data to display  Initial Impression / Assessment and Plan / UC Course  I have reviewed the triage vital signs and the nursing notes.  Pertinent labs & imaging results that were available during my care of the patient were reviewed by me and considered in my medical decision making (see chart for details).     COVID PCR and will contact if positive.  Discussed symptomatic treatment for rhinorrhea.  PCP follow-up as symptoms do not improve.  ER precautions reviewed. Final Clinical Impressions(s) / UC Diagnoses   Final diagnoses:  Exposure to COVID-19 virus  Rhinorrhea     Discharge Instructions      The clinic will contact you with results  of the COVID test done  today if positive.  I hope you feel better soon!    ED Prescriptions   None    PDMP not reviewed this encounter.   Radford Pax, NP 02/24/23 [phone]

## 2023-02-24 NOTE — Discharge Instructions (Addendum)
The clinic will contact you with results of the COVID test done today if positive.  I hope you feel better soon!

## 2023-10-03 ENCOUNTER — Other Ambulatory Visit: Payer: Self-pay

## 2023-10-03 ENCOUNTER — Ambulatory Visit: Payer: Self-pay

## 2023-10-03 ENCOUNTER — Encounter (HOSPITAL_BASED_OUTPATIENT_CLINIC_OR_DEPARTMENT_OTHER): Payer: Self-pay | Admitting: Emergency Medicine

## 2023-10-03 DIAGNOSIS — L02415 Cutaneous abscess of right lower limb: Secondary | ICD-10-CM | POA: Insufficient documentation

## 2023-10-03 NOTE — ED Triage Notes (Signed)
 Cyst on back of right thigh since Friday. Denies Hx.

## 2023-10-04 ENCOUNTER — Emergency Department (HOSPITAL_BASED_OUTPATIENT_CLINIC_OR_DEPARTMENT_OTHER)
Admission: EM | Admit: 2023-10-04 | Discharge: 2023-10-04 | Disposition: A | Discharge status: Home / Self Care | Attending: Emergency Medicine | Admitting: Emergency Medicine

## 2023-10-04 DIAGNOSIS — L0291 Cutaneous abscess, unspecified: Secondary | ICD-10-CM

## 2023-10-04 MED ORDER — LIDOCAINE-EPINEPHRINE 2 %-1:100000 IJ SOLN: 20.0000 mL | Freq: Once | INTRAMUSCULAR | Status: DC

## 2023-10-04 MED ORDER — CLINDAMYCIN HCL 150 MG PO CAPS: 300.0000 mg | ORAL_CAPSULE | Freq: Four times a day (QID) | ORAL | 0 refills | Status: AC

## 2023-10-04 MED ORDER — LIDOCAINE-EPINEPHRINE-TETRACAINE (LET) TOPICAL GEL
3.0000 mL | Freq: Once | TOPICAL | Status: AC
  Administered 2023-10-04: 3 mL via TOPICAL
  Filled 2023-10-04: qty 3

## 2023-10-04 MED ORDER — LIDOCAINE-EPINEPHRINE (PF) 2 %-1:200000 IJ SOLN
20.0000 mL | Freq: Once | INTRAMUSCULAR | Status: AC
  Administered 2023-10-04: 20 mL
  Filled 2023-10-04: qty 20

## 2023-10-04 MED ORDER — CLINDAMYCIN HCL 150 MG PO CAPS
300.0000 mg | ORAL_CAPSULE | Freq: Once | ORAL | Status: AC
  Administered 2023-10-04: 300 mg via ORAL
  Filled 2023-10-04: qty 2

## 2023-10-04 MED ORDER — OXYCODONE-ACETAMINOPHEN 5-325 MG PO TABS
1.0000 | ORAL_TABLET | Freq: Once | ORAL | Status: AC
  Administered 2023-10-04: 1 via ORAL
  Filled 2023-10-04: qty 1

## 2023-10-04 NOTE — ED Provider Notes (Signed)
 [facility] Provider Note   CSN: [id number] Arrival date & time: 10/03/23  2017     History  Chief Complaint  Patient presents with   Cyst    Mary Knight is a 33 y.o. female.  The history is provided by the patient.   Mary Knight is a 33 y.o. female who presents to the Emergency Department complaining of cyst.  She presents to the emergency department for evaluation of cyst on her right thigh that started on Friday night.  While she was in the waiting room it did pop with some clear yellow discharge.  No associated fever, nausea, vomiting.  She has pain in that area.  She has had a history of boils in the same area in the past.  No known medical problems and denies any chance of pregnancy.   Home Medications Prior to Admission medications   Medication Sig Start Date End Date Taking? Authorizing Provider  clindamycin (CLEOCIN) 150 MG capsule Take 2 capsules (300 mg total) by mouth 4 (four) times daily for 7 days. 10/04/23 10/11/23 Yes Tilden Fossa, MD  acetaminophen (TYLENOL) 325 MG tablet Take 2 tablets (650 mg total) by mouth every 6 (six) hours as needed for moderate pain. 08/10/22   Wallis Bamberg, PA-C  cetirizine (ZYRTEC ALLERGY) 10 MG tablet Take 1 tablet (10 mg total) by mouth daily. 08/10/22   Wallis Bamberg, PA-C  ibuprofen (ADVIL) 600 MG tablet Take 1 tablet (600 mg total) by mouth every 6 (six) hours as needed. 08/10/22   Wallis Bamberg, PA-C  nirmatrelvir & ritonavir (PAXLOVID, 300/100,) 20 x 150 MG & 10 x 100MG  TBPK Take 2 tablets nirmtrelvir and 1 tablet ritonavir twice daily. 08/10/22   Wallis Bamberg, PA-C  promethazine-dextromethorphan (PROMETHAZINE-DM) 6.25-15 MG/5ML syrup Take 5 mLs by mouth 3 (three) times daily as needed for cough. 08/10/22   Wallis Bamberg, PA-C  pseudoephedrine (SUDAFED) 60 MG tablet Take 1 tablet (60 mg total) by mouth every 8 (eight) hours as needed for congestion. 08/10/22   Wallis Bamberg, PA-C      Allergies     Amoxicillin    Review of Systems   Review of Systems  All other systems reviewed and are negative.   Physical Exam Updated Vital Signs BP 123/73   Pulse 84   Temp 99.6 F (37.6 C) (Oral)   Resp 16   Ht 5\' 3"  (1.6 m)   Wt 83.9 kg   LMP 09/11/2023 (Exact Date)   SpO2 100%   BMI 32.77 kg/m  Physical Exam Vitals and nursing note reviewed.  Constitutional:      Appearance: She is well-developed.  HENT:     Head: Normocephalic and atraumatic.  Cardiovascular:     Rate and Rhythm: Normal rate and regular rhythm.  Pulmonary:     Effort: Pulmonary effort is normal. No respiratory distress.  Musculoskeletal:     Comments: The right upper inner thigh has about 8 cm of erythema and induration.  There is a central 3 to 4 cm area of central fullness, fluctuance with a small amount of ecchymosis.  Skin:    General: Skin is warm and dry.  Neurological:     Mental Status: She is alert and oriented to person, place, and time.  Psychiatric:        Behavior: Behavior normal.     ED Results / Procedures / Treatments   Labs (all labs ordered are listed, but only abnormal results are displayed) Labs Reviewed  AEROBIC CULTURE W GRAM STAIN (SUPERFICIAL SPECIMEN)  CBG MONITORING, ED    EKG None  Radiology No results found.  Procedures .Incision and Drainage  Date/Time: 10/04/2023 4:08 AM  Performed by: Tilden Fossa, MD Authorized by: Tilden Fossa, MD   Consent:    Consent obtained:  Verbal   Consent given by:  Patient   Risks discussed:  Bleeding, incomplete drainage, infection and pain   Alternatives discussed:  Alternative treatment and observation Universal protocol:    Patient identity confirmed:  Verbally with patient Pre-procedure details:    Skin preparation:  Povidone-iodine Sedation:    Sedation type:  None Anesthesia:    Anesthesia method:  Topical application and local infiltration   Topical anesthetic:  LET   Local anesthetic:  Lidocaine 2% WITH  epi Procedure type:    Complexity:  Complex Procedure details:    Incision types:  Single straight   Wound management:  Irrigated with saline   Drainage:  Bloody and purulent   Drainage amount:  Copious   Wound treatment:  Wound left open   Packing materials:  None Post-procedure details:    Procedure completion:  Tolerated well, no immediate complications     Medications Ordered in ED Medications  clindamycin (CLEOCIN) capsule 300 mg (has no administration in time range)  lidocaine-EPINEPHrine-tetracaine (LET) topical gel (3 mLs Topical Given 10/04/23 0256)  oxyCODONE-acetaminophen (PERCOCET/ROXICET) 5-325 MG per tablet 1 tablet (1 tablet Oral Given 10/04/23 0253)  lidocaine-EPINEPHrine (XYLOCAINE W/EPI) 2 %-1:200000 (PF) injection 20 mL (20 mLs Infiltration Given by Other 10/04/23 0407)    ED Course/ Medical Decision Making/ A&P                                 Medical Decision Making Risk Prescription drug management.   Patient here for evaluation of swelling, pain to her right upper thigh.  She does have an abscess with surrounding cellulitis.  No systemic symptoms.  Examination is not consistent with necrotizing soft tissue infection.  I&D was performed with significant amount of purulent material returned.  Given surrounding cellulitis will start on antibiotics.  Discussed local wound care, outpatient follow-up as well as return precautions.        Final Clinical Impression(s) / ED Diagnoses Final diagnoses:  Abscess    Rx / DC Orders ED Discharge Orders          Ordered    clindamycin (CLEOCIN) 150 MG capsule  4 times daily        10/04/23 0402              Tilden Fossa, MD 10/04/23 0710

## 2023-10-07 LAB — AEROBIC CULTURE W GRAM STAIN (SUPERFICIAL SPECIMEN)
Culture: NO GROWTH
Gram Stain: NONE SEEN
